# Patient Record
Sex: Male | Born: 1935 | Race: White | Hispanic: No | Smoking: Former smoker
Health system: Southern US, Community
[De-identification: ages and names within clinical notes are randomized; demographics above are authoritative.]

## PROBLEM LIST (undated history)

## (undated) DIAGNOSIS — M1990 Unspecified osteoarthritis, unspecified site: Secondary | ICD-10-CM

## (undated) DIAGNOSIS — E785 Hyperlipidemia, unspecified: Secondary | ICD-10-CM

## (undated) DIAGNOSIS — I10 Essential (primary) hypertension: Secondary | ICD-10-CM

## (undated) DIAGNOSIS — F0390 Unspecified dementia without behavioral disturbance: Secondary | ICD-10-CM

## (undated) HISTORY — DX: Essential (primary) hypertension: I10

## (undated) HISTORY — DX: Hyperlipidemia, unspecified: E78.5

## (undated) HISTORY — DX: Unspecified dementia, unspecified severity, without behavioral disturbance, psychotic disturbance, mood disturbance, and anxiety: F03.90

## (undated) HISTORY — DX: Unspecified osteoarthritis, unspecified site: M19.90

---

## 2014-12-29 ENCOUNTER — Ambulatory Visit (INDEPENDENT_AMBULATORY_CARE_PROVIDER_SITE_OTHER): Payer: Medicare Other | Admitting: Podiatry

## 2014-12-29 ENCOUNTER — Encounter: Payer: Self-pay | Admitting: Podiatry

## 2014-12-29 VITALS — BP 116/60 | HR 64 | Resp 12

## 2014-12-29 DIAGNOSIS — B351 Tinea unguium: Secondary | ICD-10-CM | POA: Diagnosis not present

## 2014-12-29 NOTE — Progress Notes (Signed)
   Subjective:    Patient ID: George Anderson, male    DOB: 1926/04/27, 81 y.o.   MRN: [id number]  HPI  N-THICK, SORE L-B/L GREAT TOENAIL D-1 YEAR C-WORSE O-SLOWLY A-PRESSURE T-TOPICAL ANTIFUGAL TREATMENT.  Patient's wife is requesting nail debridement. There is a history of a topical medication for the hypertrophic hallux toenails, however, unknown  Review of Systems  Constitutional: Positive for activity change, fatigue and unexpected weight change.  HENT: Positive for sinus pressure.   Eyes: Positive for visual disturbance.  Gastrointestinal: Positive for constipation.  Endocrine: Positive for cold intolerance.  Musculoskeletal: Positive for myalgias, back pain, joint swelling and gait problem.  Psychiatric/Behavioral: Positive for hallucinations and confusion. The patient is nervous/anxious.    [place]    Objective:   Physical Exam   Patient is confused and not able to respond to questioning Patient's wife is is present in the treatment room today  Vascular: DP and PT pulses 2/4 bilaterally Capillary reflex immediate bilaterally  Neurological: Ankle reflex equal and reactive bilaterally Vibratory sensation patient not able to respond Sensation to 10 g monofilament wire patient unable respond  Musculoskeletal: Texture and turgor within normal limits The hallux toenails are hypertrophic and deformed The remaining toenails have occasional texture and color changes  Musculoskeletal: Patient has slow gait using a cane Mild hammertoes 2-4 bilaterally There is no restriction ankle, subtalar, midtarsal joints bilaterally        Assessment & Plan:   Assessment: Patient appears confused and not able to respond to questioning Mycotic toenails primarily hallux nails, however the remaining toenails have mild mycotic involvement  Plan: Debridement of toenails 10 without any bleeding  Advised patient's wife to  return husband when necessary for nail debridement estimated intervals 3-5 months

## 2015-05-12 ENCOUNTER — Emergency Department (HOSPITAL_COMMUNITY): Payer: Medicare Other

## 2015-05-12 ENCOUNTER — Encounter (HOSPITAL_COMMUNITY): Payer: Self-pay | Admitting: Emergency Medicine

## 2015-05-12 ENCOUNTER — Inpatient Hospital Stay (HOSPITAL_COMMUNITY)
Admission: EM | Admit: 2015-05-12 | Discharge: 2015-05-15 | DRG: 552 | Disposition: A | Discharge status: Home Health | Payer: Medicare Other | Attending: Internal Medicine | Admitting: Internal Medicine

## 2015-05-12 DIAGNOSIS — Z87891 Personal history of nicotine dependence: Secondary | ICD-10-CM | POA: Diagnosis not present

## 2015-05-12 DIAGNOSIS — S12500A Unspecified displaced fracture of sixth cervical vertebra, initial encounter for closed fracture: Principal | ICD-10-CM | POA: Diagnosis present

## 2015-05-12 DIAGNOSIS — F0390 Unspecified dementia without behavioral disturbance: Secondary | ICD-10-CM | POA: Diagnosis present

## 2015-05-12 DIAGNOSIS — F0391 Unspecified dementia with behavioral disturbance: Secondary | ICD-10-CM | POA: Diagnosis present

## 2015-05-12 DIAGNOSIS — I493 Ventricular premature depolarization: Secondary | ICD-10-CM | POA: Diagnosis present

## 2015-05-12 DIAGNOSIS — W19XXXS Unspecified fall, sequela: Secondary | ICD-10-CM | POA: Diagnosis not present

## 2015-05-12 DIAGNOSIS — R911 Solitary pulmonary nodule: Secondary | ICD-10-CM | POA: Diagnosis present

## 2015-05-12 DIAGNOSIS — B952 Enterococcus as the cause of diseases classified elsewhere: Secondary | ICD-10-CM | POA: Diagnosis present

## 2015-05-12 DIAGNOSIS — Z951 Presence of aortocoronary bypass graft: Secondary | ICD-10-CM

## 2015-05-12 DIAGNOSIS — S0101XA Laceration without foreign body of scalp, initial encounter: Secondary | ICD-10-CM | POA: Diagnosis present

## 2015-05-12 DIAGNOSIS — S129XXD Fracture of neck, unspecified, subsequent encounter: Secondary | ICD-10-CM | POA: Diagnosis not present

## 2015-05-12 DIAGNOSIS — Z66 Do not resuscitate: Secondary | ICD-10-CM | POA: Diagnosis present

## 2015-05-12 DIAGNOSIS — N390 Urinary tract infection, site not specified: Secondary | ICD-10-CM | POA: Diagnosis present

## 2015-05-12 DIAGNOSIS — M1990 Unspecified osteoarthritis, unspecified site: Secondary | ICD-10-CM | POA: Diagnosis present

## 2015-05-12 DIAGNOSIS — E785 Hyperlipidemia, unspecified: Secondary | ICD-10-CM | POA: Diagnosis present

## 2015-05-12 DIAGNOSIS — M452 Ankylosing spondylitis of cervical region: Secondary | ICD-10-CM | POA: Diagnosis present

## 2015-05-12 DIAGNOSIS — I10 Essential (primary) hypertension: Secondary | ICD-10-CM | POA: Diagnosis present

## 2015-05-12 DIAGNOSIS — W1830XA Fall on same level, unspecified, initial encounter: Secondary | ICD-10-CM | POA: Diagnosis present

## 2015-05-12 DIAGNOSIS — Z79899 Other long term (current) drug therapy: Secondary | ICD-10-CM | POA: Diagnosis not present

## 2015-05-12 DIAGNOSIS — W19XXXA Unspecified fall, initial encounter: Secondary | ICD-10-CM | POA: Diagnosis present

## 2015-05-12 DIAGNOSIS — S129XXA Fracture of neck, unspecified, initial encounter: Secondary | ICD-10-CM | POA: Diagnosis present

## 2015-05-12 LAB — URINE MICROSCOPIC-ADD ON

## 2015-05-12 LAB — BASIC METABOLIC PANEL
ANION GAP: 6 (ref 5–15)
BUN: 26 mg/dL — ABNORMAL HIGH (ref 6–20)
CHLORIDE: 107 mmol/L (ref 101–111)
CO2: 27 mmol/L (ref 22–32)
Calcium: 8.5 mg/dL — ABNORMAL LOW (ref 8.9–10.3)
Creatinine, Ser: 1.1 mg/dL (ref 0.61–1.24)
GFR calc non Af Amer: 58 mL/min — ABNORMAL LOW (ref >60)
GLUCOSE: 95 mg/dL (ref 65–99)
POTASSIUM: 4.2 mmol/L (ref 3.5–5.1)
Sodium: 140 mmol/L (ref 135–145)

## 2015-05-12 LAB — CBC WITH DIFFERENTIAL/PLATELET
BASOS PCT: 0 % (ref 0–1)
Basophils Absolute: 0 10*3/uL (ref 0.0–0.1)
Eosinophils Absolute: 0 10*3/uL (ref 0.0–0.7)
Eosinophils Relative: 0 % (ref 0–5)
HEMATOCRIT: 37.3 % — AB (ref 39.0–52.0)
HEMOGLOBIN: 12.2 g/dL — AB (ref 13.0–17.0)
LYMPHS ABS: 1.6 10*3/uL (ref 0.7–4.0)
LYMPHS PCT: 13 % (ref 12–46)
MCH: 31.7 pg (ref 26.0–34.0)
MCHC: 32.7 g/dL (ref 30.0–36.0)
MCV: 96.9 fL (ref 78.0–100.0)
MONO ABS: 0.9 10*3/uL (ref 0.1–1.0)
MONOS PCT: 8 % (ref 3–12)
NEUTROS ABS: 9.1 10*3/uL — AB (ref 1.7–7.7)
NEUTROS PCT: 79 % — AB (ref 43–77)
Platelets: 191 10*3/uL (ref 150–400)
RBC: 3.85 MIL/uL — ABNORMAL LOW (ref 4.22–5.81)
RDW: 13.8 % (ref 11.5–15.5)
WBC: 11.6 10*3/uL — ABNORMAL HIGH (ref 4.0–10.5)

## 2015-05-12 LAB — URINALYSIS, ROUTINE W REFLEX MICROSCOPIC
Bilirubin Urine: NEGATIVE
Glucose, UA: NEGATIVE mg/dL
Ketones, ur: NEGATIVE mg/dL
Nitrite: NEGATIVE
PH: 7 (ref 5.0–8.0)
Protein, ur: NEGATIVE mg/dL
SPECIFIC GRAVITY, URINE: 1.015 (ref 1.005–1.030)
UROBILINOGEN UA: 1 mg/dL (ref 0.0–1.0)

## 2015-05-12 MED ORDER — DIVALPROEX SODIUM 125 MG PO CSDR
125.0000 mg | DELAYED_RELEASE_CAPSULE | Freq: Two times a day (BID) | ORAL | Status: DC
  Administered 2015-05-12 – 2015-05-15 (×6): 125 mg via ORAL
  Filled 2015-05-12 (×8): qty 1

## 2015-05-12 MED ORDER — ALPRAZOLAM 0.25 MG PO TABS
0.1250 mg | ORAL_TABLET | Freq: Every evening | ORAL | Status: DC | PRN
  Administered 2015-05-12 – 2015-05-14 (×2): 0.125 mg via ORAL
  Filled 2015-05-12 (×2): qty 1

## 2015-05-12 MED ORDER — ACETAMINOPHEN 325 MG PO TABS
650.0000 mg | ORAL_TABLET | Freq: Four times a day (QID) | ORAL | Status: DC | PRN
  Administered 2015-05-14 – 2015-05-15 (×3): 650 mg via ORAL
  Filled 2015-05-12 (×3): qty 2

## 2015-05-12 MED ORDER — SODIUM CHLORIDE 0.9 % IV SOLN
INTRAVENOUS | Status: DC
  Administered 2015-05-13 (×2): via INTRAVENOUS

## 2015-05-12 MED ORDER — TRAMADOL HCL 50 MG PO TABS
50.0000 mg | ORAL_TABLET | Freq: Four times a day (QID) | ORAL | Status: DC | PRN
  Administered 2015-05-13 – 2015-05-15 (×6): 50 mg via ORAL
  Filled 2015-05-12 (×7): qty 1

## 2015-05-12 MED ORDER — SODIUM CHLORIDE 0.9 % IV BOLUS (SEPSIS)
500.0000 mL | Freq: Once | INTRAVENOUS | Status: AC
  Administered 2015-05-12: 500 mL via INTRAVENOUS

## 2015-05-12 MED ORDER — DEXTROSE 5 % IV SOLN
1.0000 g | INTRAVENOUS | Status: DC
  Administered 2015-05-12 – 2015-05-14 (×3): 1 g via INTRAVENOUS
  Filled 2015-05-12 (×4): qty 10

## 2015-05-12 MED ORDER — MEMANTINE HCL ER 28 MG PO CP24
28.0000 mg | ORAL_CAPSULE | Freq: Every day | ORAL | Status: DC
  Administered 2015-05-13 – 2015-05-15 (×3): 28 mg via ORAL
  Filled 2015-05-12 (×3): qty 1

## 2015-05-12 MED ORDER — ONDANSETRON HCL 4 MG/2ML IJ SOLN: 4.0000 mg | Freq: Four times a day (QID) | INTRAMUSCULAR | Status: DC | PRN

## 2015-05-12 MED ORDER — LORATADINE 10 MG PO TABS
10.0000 mg | ORAL_TABLET | Freq: Every day | ORAL | Status: DC
  Administered 2015-05-13 – 2015-05-15 (×3): 10 mg via ORAL
  Filled 2015-05-12 (×3): qty 1

## 2015-05-12 MED ORDER — ENOXAPARIN SODIUM 30 MG/0.3ML ~~LOC~~ SOLN
30.0000 mg | SUBCUTANEOUS | Status: DC
  Administered 2015-05-13 – 2015-05-15 (×3): 30 mg via SUBCUTANEOUS
  Filled 2015-05-12 (×3): qty 0.3

## 2015-05-12 MED ORDER — RIVASTIGMINE 13.3 MG/24HR TD PT24
13.3000 mg | MEDICATED_PATCH | Freq: Every day | TRANSDERMAL | Status: DC
  Administered 2015-05-13 – 2015-05-15 (×3): 13.3 mg via TRANSDERMAL
  Filled 2015-05-12 (×3): qty 1

## 2015-05-12 MED ORDER — ONDANSETRON HCL 4 MG PO TABS: 4.0000 mg | ORAL_TABLET | Freq: Four times a day (QID) | ORAL | Status: DC | PRN

## 2015-05-12 NOTE — ED Notes (Signed)
Called pharmacy to confirm that depakote was being sent.

## 2015-05-12 NOTE — Consult Note (Signed)
Reason for Consult: C6 fracture Referring Physician: Dr. Norberto Sorenson George Anderson is an 81 y.o. male.  HPI: 81 year old male with progressive dementia. Patient fell from standing height today. Questionable loss of consciousness. Obvious blow to left-sided head with subsequent laceration. Patient profoundly demented and can provide no meaningful history nor can he deliver significant complaints. Patient moving freely without evidence of motor loss. Patient with known ankylosing spondylitis.  Past Medical History  Diagnosis Date  . Arthritis   . Hyperlipidemia   . Hypertension   . Dementia     History reviewed. No pertinent past surgical history.  No family history on file.  Social History:  reports that he has quit smoking. He does not have any smokeless tobacco history on file. He reports that he does not drink alcohol or use illicit drugs.  Allergies: No Known Allergies  Medications: I have reviewed the patient's current medications.  No results found for this or any previous visit (from the past 48 hour(s)).  Ct Head Wo Contrast  05/12/2015   CLINICAL DATA:  Patient status post fall with large laceration on the back of the head. No reported loss of consciousness.  EXAM: CT HEAD WITHOUT CONTRAST  CT CERVICAL SPINE WITHOUT CONTRAST  TECHNIQUE: Multidetector CT imaging of the head and cervical spine was performed following the standard protocol without intravenous contrast. Multiplanar CT image reconstructions of the cervical spine were also generated.  COMPARISON:  None.  FINDINGS: CT HEAD FINDINGS  Ventricles and sulci are prominent compatible with atrophy. Periventricular and subcortical white matter hypodensity compatible with chronic small vessel ischemic changes. Orbits are unremarkable. Paranasal sinuses are unremarkable. Mastoid air cells are well aerated. No displaced skull fracture. Soft tissue swelling and laceration overlying left posterior calvarium.  CT CERVICAL SPINE FINDINGS   Straightening of the normal cervical lordosis. 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 There is a 3 column fracture extending through C6 vertebral body extending to the posterior elements. No evidence for hematoma within the canal on CT imaging. Marked degenerative changes in the craniocervical junction. Lateral masses articulate appropriately with the dens. Biapical pulmonary nodularity measuring up to 7 mm within the left upper lobe.  IMPRESSION: There is a 3 column fracture through C6 vertebral body extending to the posterior elements. 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  No acute intracranial process. Soft tissue injury overlying the left posterior calvarium.  Marked parenchymal atrophy more focal to the temporal lobes.  Biapical pulmonary nodularity measuring up to 7 mm. If the patient is at high risk for bronchogenic carcinoma, follow-up chest CT at 3-49months is recommended. If the patient is at low risk for bronchogenic carcinoma, follow-up chest CT at 6-12 months is recommended. This recommendation follows the consensus statement: Guidelines for Management of Small Pulmonary Nodules Detected on CT Scans: A Statement from the Fleischner Society as published in Radiology 2005; 237:395-400.  Critical Value/emergent results were called by telephone at the time of interpretation on 05/12/2015 at 12:53 pm to Dr. Donnetta Hutching , who verbally acknowledged these results.   Electronically Signed   By: Annia Belt M.D.   On: 05/12/2015 13:04   Ct Cervical Spine Wo Contrast  05/12/2015   CLINICAL DATA:  Patient status post fall with large laceration on the back of the head. No reported loss of consciousness.  EXAM: CT HEAD WITHOUT CONTRAST  CT CERVICAL SPINE WITHOUT CONTRAST  TECHNIQUE: Multidetector CT imaging of the head and cervical spine was performed following the  standard protocol without intravenous contrast. Multiplanar CT image reconstructions of  the cervical spine were also generated.  COMPARISON:  None.  FINDINGS: CT HEAD FINDINGS  Ventricles and sulci are prominent compatible with atrophy. Periventricular and subcortical white matter hypodensity compatible with chronic small vessel ischemic changes. Orbits are unremarkable. Paranasal sinuses are unremarkable. Mastoid air cells are well aerated. No displaced skull fracture. Soft tissue swelling and laceration overlying left posterior calvarium.  CT CERVICAL SPINE FINDINGS  Straightening of the normal cervical lordosis. 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 There is a 3 column fracture extending through C6 vertebral body extending to the posterior elements. No evidence for hematoma within the canal on CT imaging. Marked degenerative changes in the craniocervical junction. Lateral masses articulate appropriately with the dens. Biapical pulmonary nodularity measuring up to 7 mm within the left upper lobe.  IMPRESSION: There is a 3 column fracture through C6 vertebral body extending to the posterior elements. 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  No acute intracranial process. Soft tissue injury overlying the left posterior calvarium.  Marked parenchymal atrophy more focal to the temporal lobes.  Biapical pulmonary nodularity measuring up to 7 mm. If the patient is at high risk for bronchogenic carcinoma, follow-up chest CT at 3-81months is recommended. If the patient is at low risk for bronchogenic carcinoma, follow-up chest CT at 6-12 months is recommended. This recommendation follows the consensus statement: Guidelines for Management of Small Pulmonary Nodules Detected on CT Scans: A Statement from the Fleischner Society as published in Radiology 2005; 237:395-400.  Critical Value/emergent results were called by telephone at the time of interpretation on 05/12/2015 at 12:53 pm to Dr. Donnetta Hutching , who verbally acknowledged these results.    Electronically Signed   By: Annia Belt M.D.   On: 05/12/2015 13:04    Review of systems not obtained due to patient factors. Blood pressure 135/72, pulse 69, temperature 97.3 F (36.3 C), temperature source Axillary, resp. rate 16, SpO2 97 %. Patient is awake and alert. He is disoriented but cooperative. Cranial nerve function appears intact. Motor and sensory function of his extremities are normal. Examination head ears eyes and throat demonstrates a superficial laceration in his left frontal parietal region with underlying hematoma. No obvious bony normality. Oropharynx nasopharynx and external auditory canals are clear. Cervical spine reveals flexed posture without palpable bony abnormality. Airway midline chest and abdomen benign.  Assessment/Plan: Nondisplaced C6 fracture in the setting of ankylosing spondylitis. Situation complicated by profound dementia. I discussed situation with the patient's daughter. She does not wish to consider any type of surgical intervention which I think sounds appropriate. At this point I would recommend immobilization in an Aspen cervical collar. I would recommend admission to medical service for physical therapy evaluation prior to discharge home. Follow-up with me in 4 weeks.  Ermal Brzozowski A 05/12/2015, 2:03 PM

## 2015-05-12 NOTE — ED Notes (Signed)
Internal medicine at bedside

## 2015-05-12 NOTE — ED Provider Notes (Addendum)
CSN: [id number]     Arrival date & time 05/12/15  1004 History   First MD Initiated Contact with Patient 05/12/15 1019     Chief Complaint  Patient presents with  . Fall  . Head Laceration     (Consider location/radiation/quality/duration/timing/severity/associated sxs/prior Treatment) HPI..... Level V caveat for dementia. Patient had accidental mechanical fall earlier today striking his head on furniture. He has profound dementia, but lives with his wife independently at home. Wife reports his behavior to be normal. He now has a 2.5 cm laceration on his left parietal scalp. He states no specific areas of pain. Primary care is [facility].  Past Medical History  Diagnosis Date  . Arthritis   . Hyperlipidemia   . Hypertension   . Dementia    History reviewed. No pertinent past surgical history. No family history on file. Social History  Substance Use Topics  . Smoking status: Former Games developer  . Smokeless tobacco: None  . Alcohol Use: No    Review of Systems  Unable to perform ROS: Dementia      Allergies  Review of patient's allergies indicates no known allergies.  Home Medications   Prior to Admission medications   Medication Sig Start Date End Date Taking? Authorizing Provider  ALPRAZolam (XANAX) 0.25 MG tablet Take 0.125 mg by mouth at bedtime as needed for anxiety.   Yes Historical Provider, MD  divalproex (DEPAKOTE SPRINKLE) 125 MG capsule Take 125 mg by mouth 2 (two) times daily.  12/08/14  Yes Historical Provider, MD  loratadine (CLARITIN) 10 MG tablet Take 10 mg by mouth daily.   Yes Historical Provider, MD  NAMENDA XR 28 MG CP24 24 hr capsule  12/19/14  Yes Historical Provider, MD  Rivastigmine 13.3 MG/24HR PT24  12/08/14  Yes Historical Provider, MD  traMADol (ULTRAM) 50 MG tablet  12/24/14  Yes Historical Provider, MD  amLODipine-benazepril (LOTREL) 5-40 MG per capsule  12/09/14   Historical Provider, MD  atorvastatin (LIPITOR) 40 MG tablet  12/09/14    Historical Provider, MD   BP 162/82 mmHg  Pulse 74  Temp(Src) 97.3 F (36.3 C) (Axillary)  Resp 19  SpO2 100% Physical Exam  Constitutional:  Demented, alert  HENT:  Head: Normocephalic.  2.5 cm laceration on the left parietal scalp  Eyes: Conjunctivae and EOM are normal. Pupils are equal, round, and reactive to light.  Neck: Normal range of motion. Neck supple.  Cardiovascular: Normal rate and regular rhythm.   Pulmonary/Chest: Effort normal and breath sounds normal.  Abdominal: Soft. Bowel sounds are normal.  Musculoskeletal: Normal range of motion.  Neurological: He is alert.  Skin: Skin is warm and dry.  Psychiatric:  Demented  Nursing note and vitals reviewed.   ED Course  LACERATION REPAIR Date/Time: 05/12/2015 4:01 PM Performed by: Donnetta Hutching Authorized by: Donnetta Hutching Consent: Verbal consent obtained. Risks and benefits: risks, benefits and alternatives were discussed Consent given by: spouse Comments: 1530:   2.5 cm laceration on left parietal scalp irrigated with copious amount of normal saline. Hair trimmed. Wound was stapled 3. Patient tolerated procedure well.   (including critical care time) Labs Review Labs Reviewed  CBC WITH DIFFERENTIAL/PLATELET - Abnormal; Notable for the following:    WBC 11.6 (*)    RBC 3.85 (*)    Hemoglobin 12.2 (*)    HCT 37.3 (*)    Neutrophils Relative % 79 (*)    Neutro Abs 9.1 (*)    All other components within normal limits  BASIC  METABOLIC PANEL  URINALYSIS, ROUTINE W REFLEX MICROSCOPIC (NOT AT [facility])    Imaging Review Ct Head Wo Contrast  05/12/2015   CLINICAL DATA:  Patient status post fall with large laceration on the back of the head. No reported loss of consciousness.  EXAM: CT HEAD WITHOUT CONTRAST  CT CERVICAL SPINE WITHOUT CONTRAST  TECHNIQUE: Multidetector CT imaging of the head and cervical spine was performed following the standard protocol without intravenous contrast. Multiplanar CT image  reconstructions of the cervical spine were also generated.  COMPARISON:  None.  FINDINGS: CT HEAD FINDINGS  Ventricles and sulci are prominent compatible with atrophy. Periventricular and subcortical white matter hypodensity compatible with chronic small vessel ischemic changes. Orbits are unremarkable. Paranasal sinuses are unremarkable. Mastoid air cells are well aerated. No displaced skull fracture. Soft tissue swelling and laceration overlying left posterior calvarium.  CT CERVICAL SPINE FINDINGS  Straightening of the normal cervical lordosis. 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 There is a 3 column fracture extending through C6 vertebral body extending to the posterior elements. No evidence for hematoma within the canal on CT imaging. Marked degenerative changes in the craniocervical junction. Lateral masses articulate appropriately with the dens. Biapical pulmonary nodularity measuring up to 7 mm within the left upper lobe.  IMPRESSION: There is a 3 column fracture through C6 vertebral body extending to the posterior elements. 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  No acute intracranial process. Soft tissue injury overlying the left posterior calvarium.  Marked parenchymal atrophy more focal to the temporal lobes.  Biapical pulmonary nodularity measuring up to 7 mm. If the patient is at high risk for bronchogenic carcinoma, follow-up chest CT at 3-55months is recommended. If the patient is at low risk for bronchogenic carcinoma, follow-up chest CT at 6-12 months is recommended. This recommendation follows the consensus statement: Guidelines for Management of Small Pulmonary Nodules Detected on CT Scans: A Statement from the Fleischner Society as published in Radiology 2005; 237:395-400.  Critical Value/emergent results were called by telephone at the time of interpretation on 05/12/2015 at 12:53 pm to Dr. Donnetta Hutching , who verbally acknowledged  these results.   Electronically Signed   By: Annia Belt M.D.   On: 05/12/2015 13:04   Ct Cervical Spine Wo Contrast  05/12/2015   CLINICAL DATA:  Patient status post fall with large laceration on the back of the head. No reported loss of consciousness.  EXAM: CT HEAD WITHOUT CONTRAST  CT CERVICAL SPINE WITHOUT CONTRAST  TECHNIQUE: Multidetector CT imaging of the head and cervical spine was performed following the standard protocol without intravenous contrast. Multiplanar CT image reconstructions of the cervical spine were also generated.  COMPARISON:  None.  FINDINGS: CT HEAD FINDINGS  Ventricles and sulci are prominent compatible with atrophy. Periventricular and subcortical white matter hypodensity compatible with chronic small vessel ischemic changes. Orbits are unremarkable. Paranasal sinuses are unremarkable. Mastoid air cells are well aerated. No displaced skull fracture. Soft tissue swelling and laceration overlying left posterior calvarium.  CT CERVICAL SPINE FINDINGS  Straightening of the normal cervical lordosis. 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 There is a 3 column fracture extending through C6 vertebral body extending to the posterior elements. No evidence for hematoma within the canal on CT imaging. Marked degenerative changes in the craniocervical junction. Lateral masses articulate appropriately with the dens. Biapical pulmonary nodularity measuring up to 7 mm within the left upper lobe.  IMPRESSION: There is a 3 column fracture through  C6 vertebral body extending to the posterior elements. 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  No acute intracranial process. Soft tissue injury overlying the left posterior calvarium.  Marked parenchymal atrophy more focal to the temporal lobes.  Biapical pulmonary nodularity measuring up to 7 mm. If the patient is at high risk for bronchogenic carcinoma, follow-up chest CT at 3-33months is  recommended. If the patient is at low risk for bronchogenic carcinoma, follow-up chest CT at 6-12 months is recommended. This recommendation follows the consensus statement: Guidelines for Management of Small Pulmonary Nodules Detected on CT Scans: A Statement from the Fleischner Society as published in Radiology 2005; 237:395-400.  Critical Value/emergent results were called by telephone at the time of interpretation on 05/12/2015 at 12:53 pm to Dr. Donnetta Hutching , who verbally acknowledged these results.   Electronically Signed   By: Annia Belt M.D.   On: 05/12/2015 13:04   I have personally reviewed and evaluated these images and lab results as part of my medical decision making.   EKG Interpretation None     CRITICAL CARE Performed by: Donnetta Hutching Total critical care time: 45 min....Marland KitchenMarland KitchenCritical care time was exclusive of separately billable procedures and treating other patients. Critical care was necessary to treat or prevent imminent or life-threatening deterioration. Critical care was time spent personally by me on the following activities: development of treatment plan with patient and/or surrogate as well as nursing, discussions with consultants, evaluation of patient's response to treatment, examination of patient, obtaining history from patient or surrogate, ordering and performing treatments and interventions, ordering and review of laboratory studies, ordering and review of radiographic studies, pulse oximetry and re-evaluation of patient's condition. MDM   Final diagnoses:  C6 cervical fracture, closed, initial encounter  Fall, initial encounter  Dementia, with behavioral disturbance  Laceration of scalp, initial encounter   Patient is neurologically intact. C6 fracture discussed with Dr. Karene Fry.  His recommendation was immobilization via an Aspen collar. Laceration repaired by self. Discussed findings with the family. Admit to general medicine.  Disc c Dr Myrtis Ser, MD 05/12/15 1610  Donnetta Hutching, MD 05/12/15 [id number]

## 2015-05-12 NOTE — Progress Notes (Signed)
CSW spoke with pt's wife and daughter. At this time, they are not interested in NHP at d/c.  They would like pt to go home with [facility] services at d/c.  The patient has used [place] in the past and would like to use them again.  ED RNCM informed.

## 2015-05-12 NOTE — H&P (Signed)
[facility] History and Physical  George Anderson ZOX:[id number] DOB: 05-17-1926 DOA: 05/12/2015  Referring physician:EDP PCP: [name], MD   Chief Complaint: fall  HPI: George Anderson is a 81 y.o. male with past medical history of dementia, remote history of CABG, history of ankylosing spondylitis was brought to the ER by his wife with the above complaints. Patient goes to an adult dementia service program during the daytime and was getting ready to go there this morning. He was eating breakfast at the dining table his wife was in and underwent him getting dressed as well when she suddenly heard a Thud, she found him down on the floor in between the cabinets and he was bleeding from his scalp then. He was responsive when she found him and complained of neck pain. Subsequently EMS was called and he was brought to the emergency room where he was noted to have a C6 fracture he also had a scalp laceration with which was sutured using 3 staples. In addition he was diagnosed with a urinary tract infection yesterday when he was taken to his primary care doctor's office based on some urinary discomfort/dysuria associated with malodorous and cloudy urine.  Review of Systems: Unable to obtain due to dementia Constitutional:  No weight loss, night sweats, Fevers, chills, fatigue.  HEENT:  No headaches, Difficulty swallowing,Tooth/dental problems,Sore throat,  No sneezing, itching, ear ache, nasal congestion, post nasal drip,  Cardio-vascular:  No chest pain, Orthopnea, PND, swelling in lower extremities, anasarca, dizziness, palpitations  GI:  No heartburn, indigestion, abdominal pain, nausea, vomiting, diarrhea, change in bowel habits, loss of appetite  Resp:  No shortness of breath with exertion or at rest. No excess mucus, no productive cough, No non-productive cough, No coughing up of blood.No change in color of mucus.No wheezing.No chest wall deformity  Skin:  no rash or lesions.    GU:  no dysuria, change in color of urine, no urgency or frequency. No flank pain.  Musculoskeletal:  No joint pain or swelling. No decreased range of motion. No back pain.  Psych:  No change in mood or affect. No depression or anxiety. No memory loss.   Past Medical History  Diagnosis Date  . Arthritis   . Hyperlipidemia   . Hypertension   . Dementia    History reviewed. No pertinent past surgical history. Social History:  reports that he has quit smoking. He does not have any smokeless tobacco history on file. He reports that he does not drink alcohol or use illicit drugs.  No Known Allergies  No family history on file. -Unable to obtain due to dementia  Prior to Admission medications   Medication Sig Start Date End Date Taking? Authorizing Provider  ALPRAZolam (XANAX) 0.25 MG tablet Take 0.125 mg by mouth at bedtime as needed for anxiety.   Yes Historical Provider, MD  divalproex (DEPAKOTE SPRINKLE) 125 MG capsule Take 125 mg by mouth 2 (two) times daily.  12/08/14  Yes Historical Provider, MD  loratadine (CLARITIN) 10 MG tablet Take 10 mg by mouth daily.   Yes Historical Provider, MD  NAMENDA XR 28 MG CP24 24 hr capsule  12/19/14  Yes Historical Provider, MD  Rivastigmine 13.3 MG/24HR PT24  12/08/14  Yes Historical Provider, MD  traMADol (ULTRAM) 50 MG tablet  12/24/14  Yes Historical Provider, MD  amLODipine-benazepril (LOTREL) 5-40 MG per capsule  12/09/14   Historical Provider, MD  atorvastatin (LIPITOR) 40 MG tablet  12/09/14   Historical Provider, MD   Physical Exam:  Filed Vitals:   05/12/15 1430 05/12/15 1515 05/12/15 1645 05/12/15 1747  BP: 162/82 166/83 136/77 156/82  Pulse: 74 73  77  Temp:      TempSrc:      Resp: 19 17 14 16   SpO2: 100% 97%  99%    Wt Readings from Last 3 Encounters:  No data found for Wt    General:  Appears calm and comfortable, alert awake oriented to self only, pleasantly confused HEENT: He has a laceration on his scalp with 3 staples  wearing an Aspen collar,  Eyes: PERRL, normal lids, irises & conjunctiva ENT: grossly normal hearing, lips & tongue Neck: no LAD, masses or thyromegaly Cardiovascular: RRR, no m/r/g. No LE edema. Telemetry: SR, no arrhythmias  Respiratory: CTA bilaterally, no w/r/r. Normal respiratory effort. Abdomen: soft, ntnd Skin: no rash or induration seen on limited exam Musculoskeletal: grossly normal tone BUE/BLE Psychiatric: grossly normal mood and affect, speech fluent and appropriate Neurologic: grossly non-focal.          Labs on Admission:  Basic Metabolic Panel:  Recent Labs Lab 05/12/15 1526  NA 140  K 4.2  CL 107  CO2 27  GLUCOSE 95  BUN 26*  CREATININE 1.10  CALCIUM 8.5*   Liver Function Tests: No results for input(s): AST, ALT, ALKPHOS, BILITOT, PROT, ALBUMIN in the last 168 hours. No results for input(s): LIPASE, AMYLASE in the last 168 hours. No results for input(s): AMMONIA in the last 168 hours. CBC:  Recent Labs Lab 05/12/15 1526  WBC 11.6*  NEUTROABS 9.1*  HGB 12.2*  HCT 37.3*  MCV 96.9  PLT 191   Cardiac Enzymes: No results for input(s): CKTOTAL, CKMB, CKMBINDEX, TROPONINI in the last 168 hours.  BNP (last 3 results) No results for input(s): BNP in the last 8760 hours.  ProBNP (last 3 results) No results for input(s): PROBNP in the last 8760 hours.  CBG: No results for input(s): GLUCAP in the last 168 hours.  Radiological Exams on Admission: Ct Head Wo Contrast  05/12/2015   CLINICAL DATA:  Patient status post fall with large laceration on the back of the head. No reported loss of consciousness.  EXAM: CT HEAD WITHOUT CONTRAST  CT CERVICAL SPINE WITHOUT CONTRAST  TECHNIQUE: Multidetector CT imaging of the head and cervical spine was performed following the standard protocol without intravenous contrast. Multiplanar CT image reconstructions of the cervical spine were also generated.  COMPARISON:  None.  FINDINGS: CT HEAD FINDINGS  Ventricles and  sulci are prominent compatible with atrophy. Periventricular and subcortical white matter hypodensity compatible with chronic small vessel ischemic changes. Orbits are unremarkable. Paranasal sinuses are unremarkable. Mastoid air cells are well aerated. No displaced skull fracture. Soft tissue swelling and laceration overlying left posterior calvarium.  CT CERVICAL SPINE FINDINGS  Straightening of the normal cervical lordosis. 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 There is a 3 column fracture extending through C6 vertebral body extending to the posterior elements. No evidence for hematoma within the canal on CT imaging. Marked degenerative changes in the craniocervical junction. Lateral masses articulate appropriately with the dens. Biapical pulmonary nodularity measuring up to 7 mm within the left upper lobe.  IMPRESSION: There is a 3 column fracture through C6 vertebral body extending to the posterior elements. 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  No acute intracranial process. Soft tissue injury overlying the left posterior calvarium.  Marked parenchymal atrophy more focal to the temporal lobes.  Biapical pulmonary nodularity  measuring up to 7 mm. If the patient is at high risk for bronchogenic carcinoma, follow-up chest CT at 3-70months is recommended. If the patient is at low risk for bronchogenic carcinoma, follow-up chest CT at 6-12 months is recommended. This recommendation follows the consensus statement: Guidelines for Management of Small Pulmonary Nodules Detected on CT Scans: A Statement from the Fleischner Society as published in Radiology 2005; 237:395-400.  Critical Value/emergent results were called by telephone at the time of interpretation on 05/12/2015 at 12:53 pm to Dr. Donnetta Hutching , who verbally acknowledged these results.   Electronically Signed   By: Annia Belt M.D.   On: 05/12/2015 13:04   Ct Cervical Spine Wo  Contrast  05/12/2015   CLINICAL DATA:  Patient status post fall with large laceration on the back of the head. No reported loss of consciousness.  EXAM: CT HEAD WITHOUT CONTRAST  CT CERVICAL SPINE WITHOUT CONTRAST  TECHNIQUE: Multidetector CT imaging of the head and cervical spine was performed following the standard protocol without intravenous contrast. Multiplanar CT image reconstructions of the cervical spine were also generated.  COMPARISON:  None.  FINDINGS: CT HEAD FINDINGS  Ventricles and sulci are prominent compatible with atrophy. Periventricular and subcortical white matter hypodensity compatible with chronic small vessel ischemic changes. Orbits are unremarkable. Paranasal sinuses are unremarkable. Mastoid air cells are well aerated. No displaced skull fracture. Soft tissue swelling and laceration overlying left posterior calvarium.  CT CERVICAL SPINE FINDINGS  Straightening of the normal cervical lordosis. 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 There is a 3 column fracture extending through C6 vertebral body extending to the posterior elements. No evidence for hematoma within the canal on CT imaging. Marked degenerative changes in the craniocervical junction. Lateral masses articulate appropriately with the dens. Biapical pulmonary nodularity measuring up to 7 mm within the left upper lobe.  IMPRESSION: There is a 3 column fracture through C6 vertebral body extending to the posterior elements. 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  No acute intracranial process. Soft tissue injury overlying the left posterior calvarium.  Marked parenchymal atrophy more focal to the temporal lobes.  Biapical pulmonary nodularity measuring up to 7 mm. If the patient is at high risk for bronchogenic carcinoma, follow-up chest CT at 3-83months is recommended. If the patient is at low risk for bronchogenic carcinoma, follow-up chest CT at 6-12 months is  recommended. This recommendation follows the consensus statement: Guidelines for Management of Small Pulmonary Nodules Detected on CT Scans: A Statement from the Fleischner Society as published in Radiology 2005; 237:395-400.  Critical Value/emergent results were called by telephone at the time of interpretation on 05/12/2015 at 12:53 pm to Dr. Donnetta Hutching , who verbally acknowledged these results.   Electronically Signed   By: Annia Belt M.D.   On: 05/12/2015 13:04    EKG: Independently reviewed.pending, ordered  Assessment/Plan  1.  Cervical spine fracture -Seen by neurosurgery Dr. Edwyna Perfect recommended Aspen collar, conservative management -Follow-up with Dr. Jordan Likes in 4 weeks -Controlled with Tylenol and tramadol at this time -Physical therapy and OT evaluation  2. Urinary tract infection -Start IV Rocephin, follow-up urine cultures  3. Dementia with history of sundowning and behavioral problems -Continue home regimen of Namenda, and a statement, Depakote and Xanax at bedtime  4. Lung nodule -Needs follow-up  5. Scalp laceration -Local wound care  6. Fall  -Unable to rule out syncope , check EKG -Denies any chest pain or any other cardio pulmonary  symptoms  -Monitor on  telemetry   Code Status: DNR DVT Prophylaxis: lovenox Family Communication: wife Disposition Plan: home vs SNF  Time spent:  [facility] Pager [phone]

## 2015-05-12 NOTE — ED Notes (Signed)
From home via [facility], fell, hit head, no LOC, has large lac to back of head, bleeding controlled on arrival, no blood thinners, VSS, hx dementia, c-collar in place

## 2015-05-13 ENCOUNTER — Encounter (HOSPITAL_COMMUNITY): Payer: Self-pay | Admitting: *Deleted

## 2015-05-13 DIAGNOSIS — F0391 Unspecified dementia with behavioral disturbance: Secondary | ICD-10-CM

## 2015-05-13 DIAGNOSIS — S129XXD Fracture of neck, unspecified, subsequent encounter: Secondary | ICD-10-CM

## 2015-05-13 DIAGNOSIS — W19XXXS Unspecified fall, sequela: Secondary | ICD-10-CM

## 2015-05-13 DIAGNOSIS — N390 Urinary tract infection, site not specified: Secondary | ICD-10-CM

## 2015-05-13 DIAGNOSIS — R911 Solitary pulmonary nodule: Secondary | ICD-10-CM

## 2015-05-13 LAB — CBC
HCT: 32.8 % — ABNORMAL LOW (ref 39.0–52.0)
Hemoglobin: 10.8 g/dL — ABNORMAL LOW (ref 13.0–17.0)
MCH: 32.2 pg (ref 26.0–34.0)
MCHC: 32.9 g/dL (ref 30.0–36.0)
MCV: 97.9 fL (ref 78.0–100.0)
PLATELETS: 173 10*3/uL (ref 150–400)
RBC: 3.35 MIL/uL — AB (ref 4.22–5.81)
RDW: 13.9 % (ref 11.5–15.5)
WBC: 7.4 10*3/uL (ref 4.0–10.5)

## 2015-05-13 LAB — BASIC METABOLIC PANEL
ANION GAP: 7 (ref 5–15)
BUN: 21 mg/dL — ABNORMAL HIGH (ref 6–20)
CALCIUM: 8.5 mg/dL — AB (ref 8.9–10.3)
CO2: 23 mmol/L (ref 22–32)
Chloride: 110 mmol/L (ref 101–111)
Creatinine, Ser: 1.02 mg/dL (ref 0.61–1.24)
GLUCOSE: 90 mg/dL (ref 65–99)
POTASSIUM: 4.5 mmol/L (ref 3.5–5.1)
SODIUM: 140 mmol/L (ref 135–145)

## 2015-05-13 NOTE — Care Management Note (Signed)
Case Management Note  Patient Details  Name: George Anderson MRN: [id number] Date of Birth: 04-13-1926  Subjective/Objective:          CM following for progression and d/c planning.          Action/Plan: Noted referral for HH needs, pt has used [place] [facility] services in the past and wishes to use that agency in the future. Romero Belling notified. Await orders.  Expected Discharge Date:                  Expected Discharge Plan:  Home w Home Health Services  In-House Referral:  NA  Discharge planning Services  CM Consult  Post Acute Care Choice:    Choice offered to:     DME Arranged:    DME Agency:     HH Arranged:    HH Agency:  [facility]  Status of Service:  In process, will continue to follow  Medicare Important Message Given:    Date Medicare IM Given:    Medicare IM give by:    Date Additional Medicare IM Given:    Additional Medicare Important Message give by:     If discussed at Long Length of Stay Meetings, dates discussed:    Additional Comments:  Starlyn Skeans, RN 05/13/2015, 2:43 PM

## 2015-05-13 NOTE — Progress Notes (Signed)
Orthopedic Tech Progress Note Patient Details:  Joh Rao Mar 13, 1926 [id number]  Patient ID: George Anderson, male   DOB: Mar 24, 1926, 81 y.o.   MRN: [id number] Called in bio-tech brace order; spoke with [name], George Anderson 05/13/2015, 10:48 AM

## 2015-05-13 NOTE — Progress Notes (Signed)
Patient Demographics  George Anderson, is a 81 y.o. male, DOB - July 08, 1926, ZOX:[id number]  Admit date - 05/12/2015   Admitting Physician Zannie Cove, MD  Outpatient Primary MD for the patient is George Inch, MD  LOS - 1   Chief Complaint  Patient presents with  . Fall  . Head Laceration         Subjective:   Illene Anderson today has, No headache, No chest pain, No abdominal pain - No Nausea, No Cough - SOB.   Assessment & Plan    Principal Problem:   Cervical spine fracture Active Problems:   Fall   Dementia   Lung nodule   UTI (lower urinary tract infection)  Cervical spine fracture -Seen by neurosurgery Dr. Jordan Likes recommended Aspen collar, conservative management ,Follow-up with Dr. Jordan Likes in 4 weeks - When necessary pain medicine -Physical therapy and OT evaluation  Urinary tract infection -Start IV Rocephin, follow-up urine cultures   Dementia with history of sundowning and behavioral problems -Continue home regimen of Namenda, and a statement, Depakote and Xanax at bedtime  Lung nodules -Needs follow-up  Scalp laceration -Local wound care   Fall  -Unable to rule out syncope ,  - EKG nonacute -Denies any chest pain or any other cardio pulmonary symptoms  - Monitor on telemetry   Code Status: DNR  Family Communication: none at bedside  Disposition Plan: Pending PT evaluation    Procedures None  Consults   Neurosurgery   Medications  Scheduled Meds: . cefTRIAXone (ROCEPHIN)  IV  1 g Intravenous Q24H  . divalproex  125 mg Oral BID  . enoxaparin (LOVENOX) injection  30 mg Subcutaneous Q24H  . loratadine  10 mg Oral Daily  . memantine  28 mg Oral Daily  . Rivastigmine  13.3 mg Transdermal Daily   Continuous Infusions: . sodium chloride 75 mL/hr at 05/13/15 0227   PRN Meds:.acetaminophen, ALPRAZolam, ondansetron **OR** ondansetron (ZOFRAN) IV,  traMADol  DVT Prophylaxis  Lovenox   Lab Results  Component Value Date   PLT 173 05/13/2015    Antibiotics    Anti-infectives    Start     Dose/Rate Route Frequency Ordered Stop   05/12/15 1800  cefTRIAXone (ROCEPHIN) 1 g in dextrose 5 % 50 mL IVPB     1 g 100 mL/hr over 30 Minutes Intravenous Every 24 hours 05/12/15 1748            Objective:   Filed Vitals:   05/12/15 1930 05/12/15 2159 05/13/15 0504 05/13/15 0933  BP: 145/100 174/81 168/54 161/86  Pulse: 77 81 63 80  Temp:  97.4 F (36.3 C) 97.8 F (36.6 C) 97.5 F (36.4 C)  TempSrc:  Axillary Axillary Oral  Resp: Weight:  71.305 kg (157 lb 3.2 oz)    SpO2: 100% 99% 99% 98%    Wt Readings from Last 3 Encounters:  05/12/15 71.305 kg (157 lb 3.2 oz)     Intake/Output Summary (Last 24 hours) at 05/13/15 1339 Last data filed at 05/13/15 0818  Gross per 24 hour  Intake    500 ml  Output      0 ml  Net    500 ml     Physical Exam  Awake Alert, confused, laceration and right scalp with staples. Aspen collar,No JVD,  Symmetrical Chest wall movement, Good air movement bilaterally,  RRR,No Gallops, No Parasternal Heave +ve B.Sounds, Abd Soft, No tenderness, No organomegaly appriciated, No rebound - guarding or rigidity. No Cyanosis, Clubbing or edema,   Data Review   Micro Results Recent Results (from the past 240 hour(s))  Culture, Urine     Status: None (Preliminary result)   Collection Time: 05/12/15  4:30 PM  Result Value Ref Range Status   Specimen Description URINE, RANDOM  Final   Special Requests NONE  Final   Culture CULTURE REINCUBATED FOR BETTER GROWTH  Final   Report Status PENDING  Incomplete    Radiology Reports Ct Head Wo Contrast  05/12/2015   CLINICAL DATA:  Patient status post fall with large laceration on the back of the head. No reported loss of consciousness.  EXAM: CT HEAD WITHOUT CONTRAST  CT CERVICAL SPINE WITHOUT CONTRAST  TECHNIQUE: Multidetector CT imaging of  the head and cervical spine was performed following the standard protocol without intravenous contrast. Multiplanar CT image reconstructions of the cervical spine were also generated.  COMPARISON:  None.  FINDINGS: CT HEAD FINDINGS  Ventricles and sulci are prominent compatible with atrophy. Periventricular and subcortical white matter hypodensity compatible with chronic small vessel ischemic changes. Orbits are unremarkable. Paranasal sinuses are unremarkable. Mastoid air cells are well aerated. No displaced skull fracture. Soft tissue swelling and laceration overlying left posterior calvarium.  CT CERVICAL SPINE FINDINGS  Straightening of the normal cervical lordosis. 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 There is a 3 column fracture extending through C6 vertebral body extending to the posterior elements. No evidence for hematoma within the canal on CT imaging. Marked degenerative changes in the craniocervical junction. Lateral masses articulate appropriately with the dens. Biapical pulmonary nodularity measuring up to 7 mm within the left upper lobe.  IMPRESSION: There is a 3 column fracture through C6 vertebral body extending to the posterior elements. 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  No acute intracranial process. Soft tissue injury overlying the left posterior calvarium.  Marked parenchymal atrophy more focal to the temporal lobes.  Biapical pulmonary nodularity measuring up to 7 mm. If the patient is at high risk for bronchogenic carcinoma, follow-up chest CT at 3-21months is recommended. If the patient is at low risk for bronchogenic carcinoma, follow-up chest CT at 6-12 months is recommended. This recommendation follows the consensus statement: Guidelines for Management of Small Pulmonary Nodules Detected on CT Scans: A Statement from the Fleischner Society as published in Radiology 2005; 237:395-400.  Critical Value/emergent results  were called by telephone at the time of interpretation on 05/12/2015 at 12:53 pm to Dr. Donnetta Hutching , who verbally acknowledged these results.   Electronically Signed   By: Annia Belt M.D.   On: 05/12/2015 13:04   Ct Cervical Spine Wo Contrast  05/12/2015   CLINICAL DATA:  Patient status post fall with large laceration on the back of the head. No reported loss of consciousness.  EXAM: CT HEAD WITHOUT CONTRAST  CT CERVICAL SPINE WITHOUT CONTRAST  TECHNIQUE: Multidetector CT imaging of the head and cervical spine was performed following the standard protocol without intravenous contrast. Multiplanar CT image reconstructions of the cervical spine were also generated.  COMPARISON:  None.  FINDINGS: CT HEAD FINDINGS  Ventricles and sulci are prominent compatible with atrophy. Periventricular and subcortical white matter hypodensity compatible with chronic small vessel ischemic  changes. Orbits are unremarkable. Paranasal sinuses are unremarkable. Mastoid air cells are well aerated. No displaced skull fracture. Soft tissue swelling and laceration overlying left posterior calvarium.  CT CERVICAL SPINE FINDINGS  Straightening of the normal cervical lordosis. 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 There is a 3 column fracture extending through C6 vertebral body extending to the posterior elements. No evidence for hematoma within the canal on CT imaging. Marked degenerative changes in the craniocervical junction. Lateral masses articulate appropriately with the dens. Biapical pulmonary nodularity measuring up to 7 mm within the left upper lobe.  IMPRESSION: There is a 3 column fracture through C6 vertebral body extending to the posterior elements. 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  No acute intracranial process. Soft tissue injury overlying the left posterior calvarium.  Marked parenchymal atrophy more focal to the temporal lobes.  Biapical pulmonary  nodularity measuring up to 7 mm. If the patient is at high risk for bronchogenic carcinoma, follow-up chest CT at 3-75months is recommended. If the patient is at low risk for bronchogenic carcinoma, follow-up chest CT at 6-12 months is recommended. This recommendation follows the consensus statement: Guidelines for Management of Small Pulmonary Nodules Detected on CT Scans: A Statement from the Fleischner Society as published in Radiology 2005; 237:395-400.  Critical Value/emergent results were called by telephone at the time of interpretation on 05/12/2015 at 12:53 pm to Dr. Donnetta Hutching , who verbally acknowledged these results.   Electronically Signed   By: Annia Belt M.D.   On: 05/12/2015 13:04     CBC  Recent Labs Lab 05/12/15 1526 05/13/15 0436  WBC 11.6* 7.4  HGB 12.2* 10.8*  HCT 37.3* 32.8*  PLT 191 173  MCV 96.9 97.9  MCH 31.7 32.2  MCHC 32.7 32.9  RDW 13.8 13.9  LYMPHSABS 1.6  --   MONOABS 0.9  --   EOSABS 0.0  --   BASOSABS 0.0  --     Chemistries   Recent Labs Lab 05/12/15 1526 05/13/15 0436  NA 140 140  K 4.2 4.5  CL 107 110  CO2 27 23  GLUCOSE 95 90  BUN 26* 21*  CREATININE 1.10 1.02  CALCIUM 8.5* 8.5*   ------------------------------------------------------------------------------------------------------------------ CrCl cannot be calculated (Unknown ideal weight.). ------------------------------------------------------------------------------------------------------------------ No results for input(s): HGBA1C in the last 72 hours. ------------------------------------------------------------------------------------------------------------------ No results for input(s): CHOL, HDL, LDLCALC, TRIG, CHOLHDL, LDLDIRECT in the last 72 hours. ------------------------------------------------------------------------------------------------------------------ No results for input(s): TSH, T4TOTAL, T3FREE, THYROIDAB in the last 72 hours.  Invalid input(s):  FREET3 ------------------------------------------------------------------------------------------------------------------ No results for input(s): VITAMINB12, FOLATE, FERRITIN, TIBC, IRON, RETICCTPCT in the last 72 hours.  Coagulation profile No results for input(s): INR, PROTIME in the last 168 hours.  No results for input(s): DDIMER in the last 72 hours.  Cardiac Enzymes No results for input(s): CKMB, TROPONINI, MYOGLOBIN in the last 168 hours.  Invalid input(s): CK ------------------------------------------------------------------------------------------------------------------ Invalid input(s): POCBNP     Time Spent in minutes   30 minutes   Blasa Raisch M.D on 05/13/2015 at 1:39 PM  Between 7am to 7pm - Pager - [phone]  After 7pm go to [email or web address] - password [facility]  [facility]   Office  [phone]

## 2015-05-14 DIAGNOSIS — N390 Urinary tract infection, site not specified: Secondary | ICD-10-CM

## 2015-05-14 DIAGNOSIS — B952 Enterococcus as the cause of diseases classified elsewhere: Secondary | ICD-10-CM

## 2015-05-14 MED ORDER — MORPHINE SULFATE (PF) 2 MG/ML IV SOLN
2.0000 mg | Freq: Once | INTRAVENOUS | Status: AC
  Administered 2015-05-14: 2 mg via INTRAVENOUS
  Filled 2015-05-14: qty 1

## 2015-05-14 NOTE — Clinical Social Work Placement (Signed)
   CLINICAL SOCIAL WORK PLACEMENT  NOTE  Date:  05/14/2015  Patient Details  Name: George Anderson MRN: [id number] Date of Birth: [birth year]  Clinical Social Work is seeking post-discharge placement for this patient at the Skilled  Nursing Facility level of care (*CSW will initial, date and re-position this form in  chart as items are completed):  Yes   Patient/family provided with [facility] Clinical Social Work Department's list of facilities offering this level of care within the geographic area requested by the patient (or if unable, by the patient's family).  Yes   Patient/family informed of their freedom to choose among providers that offer the needed level of care, that participate in Medicare, Medicaid or managed care program needed by the patient, have an available bed and are willing to accept the patient.  Yes   Patient/family informed of [facility]'s ownership interest in [facility] and [facility], as well as of the fact that they are under no obligation to receive care at these facilities.  PASRR submitted to EDS on 05/14/15     PASRR number received on 05/14/15     Existing PASRR number confirmed on       FL2 transmitted to all facilities in geographic area requested by pt/family on 05/14/15     FL2 transmitted to all facilities within larger geographic area on       Patient informed that his/her managed care company has contracts with or will negotiate with certain facilities, including the following:            Patient/family informed of bed offers received.  Patient chooses bed at       Physician recommends and patient chooses bed at      Patient to be transferred to   on  .  Patient to be transferred to facility by       Patient family notified on   of transfer.  Name of family member notified:        PHYSICIAN       Additional Comment:    _______________________________________________ Cristobal Goldmann, LCSW 05/14/2015, 7:34  PM

## 2015-05-14 NOTE — Evaluation (Signed)
Occupational Therapy Evaluation & Discharge Patient Details Name: George Anderson MRN: [id number] DOB: Nov 20, 1925 Today's Date: 05/14/2015    History of Present Illness Patient is an 81 yo male admitted 05/12/15 following a fall with C6 fracture.  Patient in Aspen collar for conservative treatment.   PMH:  Dementia, CABG, HTN, arthritis   Clinical Impression   Pt admitted to hospital due to reason stated above. Pt currently with functional limitiations due to cognitive deficits and pain. Prior to admission pt required total assistance for ADL completion. Pt currently requires the same level of assistance previously stated due to severe cognitive deficits limiting pt's ability to engage in ADL. Upon arrival pt was lying in bed with wife feeding pt. Per pt's wife pt needs to return home at a supervision level of assistance. Pt will benefit from skilled OT at SNF to allow safe discharge home with wife. Will defer further OT to SNF.    Follow Up Recommendations  SNF    Equipment Recommendations       Recommendations for Other Services       Precautions / Restrictions Precautions Precautions: Fall;Cervical Required Braces or Orthoses: Cervical Brace Cervical Brace: Hard collar;At all times Restrictions Weight Bearing Restrictions: No      Mobility Bed Mobility   Transfers                      Balance                                     ADL Overall ADL's : Needs assistance/impaired                                       General ADL Comments: Pt is total assist for all aspects of ADLs     Vision     Perception     Praxis      Pertinent Vitals/Pain Pain Assessment: Faces Pain Score: 5  Faces Pain Scale: Hurts little more Pain Location: neck Pain Descriptors / Indicators: Grimacing;Guarding;Headache Pain Intervention(s): Repositioned;Patient requesting pain meds-RN notified     Hand Dominance Right   Extremity/Trunk  Assessment Upper Extremity Assessment Upper Extremity Assessment:  (Unable to accurately assess due to pain and cervical fx)   Lower Extremity Assessment Lower Extremity Assessment: Defer to PT evaluation   Cervical / Trunk Assessment Cervical / Trunk Assessment: Kyphotic;Other exceptions Cervical / Trunk Exceptions: Cervical collar in place.   Communication Communication Communication: HOH   Cognition Arousal/Alertness: Awake/alert Behavior During Therapy: Agitated Overall Cognitive Status: History of cognitive impairments - at baseline Area of Impairment: Memory;Safety/judgement;Awareness Orientation Level: Disoriented to;Place;Time;Situation Current Attention Level: Selective Memory: Decreased short-term memory   Safety/Judgement: Decreased awareness of safety;Decreased awareness of deficits   Problem Solving: Slow processing;Requires verbal cues General Comments: Unsure of patient's baseline level of dementia   General Comments       Exercises       Shoulder Instructions      Home Living Family/patient expects to be discharged to:: Private residence Living Arrangements: Spouse/significant other Available Help at Discharge: Family;Available 24 hours/day Type of Home: House Home Access: Level entry     Home Layout: One level     Bathroom Shower/Tub: Tub only   Firefighter: Standard Bathroom Accessibility: Yes How Accessible: Accessible via walker Home Equipment: Grab bars -  toilet;Cane - single point;Wheelchair - [employer] - 4 wheels          Prior Functioning/Environment Level of Independence: Needs assistance  Gait / Transfers Assistance Needed: Uses cane for ambulation ADL's / Homemaking Assistance Needed: Per pt's wife she assist pt's with ADLs/IADLs        OT Diagnosis: Generalized weakness;Cognitive deficits;Acute pain   OT Problem List: Decreased cognition;Decreased safety awareness;Decreased knowledge of precautions   OT  Treatment/Interventions:      OT Goals(Current goals can be found in the care plan section) Acute Rehab OT Goals Patient Stated Goal: Pt unable to state goal. Per pt's wife for pt to return home  OT Frequency:     Barriers to D/C:            Co-evaluation              End of Session Equipment Utilized During Treatment: Cervical collar  Activity Tolerance: Patient limited by pain;Treatment limited secondary to agitation Patient left: in bed;with call bell/phone within reach;with bed alarm set;with nursing/sitter in room;with family/visitor present   Time: 1310-1346 OT Time Calculation (min): 36 min Charges:  OT Evaluation $Initial OT Evaluation Tier I: 1 Procedure OT Treatments $Therapeutic Activity: 8-22 mins G-Codes:    Smiley Houseman 2015-06-06, 3:09 PM

## 2015-05-14 NOTE — Clinical Social Work Note (Signed)
Clinical Social Work Assessment  Patient Details  Name: George Anderson MRN: [id number] Date of Birth: 07-07-1926  Date of referral:  05/14/15               Reason for consult:  Facility Placement                Permission sought to share information with:  Family Supports Permission granted to share information::  Yes, Verbal Permission Granted (Patient oriented to self only. Talked with wife and daughter at bedside.)  Name::     Mrs. Knotek and daughter Fayrene Helper  Agency::     Relationship::  Wife and daughter  Solicitor Information:  Daughter - [phone]. Wife usually at the bedside.  Housing/Transportation Living arrangements for the past 2 months:  Single Family Home Source of Information:  Spouse, Adult Children Patient Interpreter Needed:  None Criminal Activity/Legal Involvement Pertinent to Current Situation/Hospitalization:  No - Comment as needed Significant Relationships:  Adult Children, Spouse Lives with:  Spouse Do you feel safe going back to the place where you live?  Yes (Patient's home environment safe, however patient weak and needs rehab before going home.) Need for family participation in patient care:  Yes (Comment)  Care giving concerns:  Daughter expressed that patient is weak and cannot be cared for at home at discharge.   Social Worker assessment / plan:  CSW talked with patient's wife and daughter regarding discharge planning and ST rehab at a skilled facility. Patient was lying in bed and was awake, calm and verbal, but confused. CSW explained skilled facility search to family and provided SNF list for [facility] and questions were answered. Family's preference is [facility] and daughter plans to visit [facility] to determine if this will be their second choice.  Employment status:  Retired Engineer, mining, Games developer PT Recommendations:  Skilled Nursing Facility Information / Referral to community resources:  Skilled Nursing  Facility (Family given SNF list)  Patient/Family's Response to care:  No concerns expressed regarding care during this hospitalization.  Patient/Family's Understanding of and Emotional Response to Diagnosis, Current Treatment, and Prognosis:  Not discussed.  Emotional Assessment Appearance:  Appears stated age Attitude/Demeanor/Rapport:  Other (Patient talkative but confused.) Affect (typically observed):  Calm Orientation:  Oriented to Self Alcohol / Substance use:  Tobacco Use (Patient has quit smoking. He does not drink or use illicit drugs.) Psych involvement (Current and /or in the community):  No (Comment)  Discharge Needs  Concerns to be addressed:  Discharge Planning Concerns Readmission within the last 30 days:  No Current discharge risk:  None Barriers to Discharge:  No Barriers Identified   Cristobal Goldmann, LCSW 05/14/2015, 7:19 PM

## 2015-05-14 NOTE — Evaluation (Signed)
Physical Therapy Evaluation Patient Details Name: George Anderson MRN: [id number] DOB: [birth year] Today's Date: 05/14/2015   History of Present Illness  Patient is an 81 yo male admitted 05/12/15 following a fall with C6 fracture.  Patient in Aspen collar for conservative treatment.   PMH:  Dementia, CABG, HTN, arthritis  Clinical Impression  Patient presents with problems listed below.  Will benefit from acute PT to maximize functional mobility prior to discharge.  Patient was able to ambulate with cane pta.  Today, required +2 asist to sit EOB.  Recommend ST-SNF for continued therapy at discharge.  Patient will need to reach supervision level to return home with wife.    Follow Up Recommendations SNF;Supervision/Assistance - 24 hour    Equipment Recommendations  None recommended by PT    Recommendations for Other Services       Precautions / Restrictions Precautions Precautions: Fall;Cervical Required Braces or Orthoses: Cervical Brace Cervical Brace: Hard collar;At all times Restrictions Weight Bearing Restrictions: No      Mobility  Bed Mobility Overal bed mobility: Needs Assistance;+2 for physical assistance Bed Mobility: Supine to Sit;Sit to Supine     Supine to sit: Max assist;+2 for physical assistance Sit to supine: Max assist;+2 for physical assistance   General bed mobility comments: Verbal and tactile cues for mobility.  Patient did not initiate movement.  Required +2 assist to move LE's off of bed and raise trunk to sitting position.  Patient yelled out with pain.  Patient pushing back, attempting to return to supine.  Distracted patient with UE/LE movement requests.  Patient able to sit EOB x 5 minutes with min to min guard assist for balance.  Returned to supine with +2 max assist.  Transfers                    Ambulation/Gait                Stairs            Wheelchair Mobility    Modified Rankin (Stroke Patients Only)        Balance Overall balance assessment: Needs assistance Sitting-balance support: Single extremity supported;Feet supported Sitting balance-Leahy Scale: Poor Sitting balance - Comments: Required min to min guard assist for sitting balance.                                       Pertinent Vitals/Pain Pain Assessment: Faces Faces Pain Scale: Hurts even more Pain Location: Neck and back Pain Descriptors / Indicators: Grimacing;Guarding;Moaning (Yelling out with transition to sitting) Pain Intervention(s): Limited activity within patient's tolerance;Repositioned    Home Living Family/patient expects to be discharged to:: Skilled Nursing Facility Living Arrangements: Spouse/significant other Available Help at Discharge: Family;Available 24 hours/day (wife with limited ability to provide physical assist. Type of Home: House       Home Layout: One level Home Equipment: Grab bars - toilet;Cane - single point;Wheelchair - [employer] - 4 wheels      Prior Function Level of Independence: Needs assistance   Gait / Transfers Assistance Needed: Uses cane for ambulation  ADL's / Homemaking Assistance Needed: Per pt's wife she assist pt's with ADLs/IADLs        Hand Dominance   Dominant Hand: Right    Extremity/Trunk Assessment   Upper Extremity Assessment: Defer to OT evaluation           Lower Extremity Assessment:  Generalized weakness      Cervical / Trunk Assessment: Kyphotic;Other exceptions  Communication   Communication: HOH  Cognition Arousal/Alertness: Awake/alert Behavior During Therapy: Agitated;Restless Overall Cognitive Status: No family/caregiver present to determine baseline cognitive functioning Area of Impairment: Orientation;Attention;Memory;Safety/judgement;Problem solving Orientation Level: Disoriented to;Place;Time;Situation Current Attention Level: Focused Memory: Decreased short-term memory   Safety/Judgement: Decreased awareness  of deficits;Decreased awareness of safety   Problem Solving: Slow processing;Difficulty sequencing;Requires verbal cues General Comments: Unsure of patient's baseline level of dementia    General Comments      Exercises        Assessment/Plan    PT Assessment Patient needs continued PT services  PT Diagnosis Difficulty walking;Generalized weakness;Acute pain;Altered mental status   PT Problem List Decreased strength;Decreased activity tolerance;Decreased balance;Decreased mobility;Decreased cognition;Decreased safety awareness;Pain  PT Treatment Interventions DME instruction;Gait training;Functional mobility training;Therapeutic activities;Cognitive remediation;Patient/family education   PT Goals (Current goals can be found in the Care Plan section) Acute Rehab PT Goals Patient Stated Goal: Patient unable to state.  Wife:  safe d/c plan PT Goal Formulation: With patient/family Time For Goal Achievement: 05/28/15 Potential to Achieve Goals: Good    Frequency Min 3X/week   Barriers to discharge Decreased caregiver support Do not feel wife can provide patient with level of assist currently needed.    Co-evaluation               End of Session Equipment Utilized During Treatment: Cervical collar Activity Tolerance: Patient limited by pain;Treatment limited secondary to agitation Patient left: in bed;with call bell/phone within reach;with bed alarm set Nurse Communication: Mobility status         Time: 1610-9604 PT Time Calculation (min) (ACUTE ONLY): 18 min   Charges:   PT Evaluation $Initial PT Evaluation Tier I: 1 Procedure     PT G CodesVena Austria 2015/05/21, 1:31 PM Durenda Hurt. Renaldo Fiddler, [facility] Acute Rehab Services Pager [phone]

## 2015-05-14 NOTE — Progress Notes (Signed)
Utilization review completed. Emerald Shor, RN, BSN. 

## 2015-05-14 NOTE — Progress Notes (Signed)
Patient Demographics  George Anderson, is a 81 y.o. male, DOB - [birth year], ZOX:[id number]  Admit date - 05/12/2015   Admitting Physician Zannie Cove, MD  Outpatient Primary MD for the patient is George Inch, MD  LOS - 2   Chief Complaint  Patient presents with  . Fall  . Head Laceration         Subjective:   Illene Bolus today has, No headache, No chest pain, No abdominal pain - No Nausea, No Cough - SOB.   Assessment & Plan    Principal Problem:   Cervical spine fracture Active Problems:   Fall   Dementia   Lung nodule   UTI (lower urinary tract infection)  Cervical spine fracture -Seen by neurosurgery Dr. Jordan Likes recommended Aspen collar, conservative management ,Follow-up with Dr. Jordan Likes in 4 weeks - When necessary pain medicine -Physical therapy and OT evaluation recommended SNF placement.  Urinary tract infection - treated with IV Rocephin, urine culture growing enterococcus species, wilfollow on sensitivity   Dementia with history of sundowning and behavioral problems -Continue home regimen of Namenda, and rivastigmine  , Depakote and Xanax at bedtime  Lung nodules -Needs outpatient  follow-up,  will need repeat CT chest in 6 mon th.  Scalp laceration -Local wound care   Fall  -Unable to rule out syncope ,  - EKG nonacute -Denies any chest pain or any other cardio pulmonary symptoms,  - no significant events on telemetry   Status: DNR Family Communication: spoke with daughter over the phone. Disposition Plan: SNF in 24 hours   Procedures None  Consults   Neurosurgery   Medications  Scheduled Meds: . cefTRIAXone (ROCEPHIN)  IV  1 g Intravenous Q24H  . divalproex  125 mg Oral BID  . enoxaparin (LOVENOX) injection  30 mg Subcutaneous Q24H  . loratadine  10 mg Oral Daily  . memantine  28 mg Oral Daily  . Rivastigmine  13.3 mg Transdermal Daily    Continuous Infusions: . sodium chloride 75 mL/hr at 05/13/15 1649   PRN Meds:.acetaminophen, ALPRAZolam, ondansetron **OR** ondansetron (ZOFRAN) IV, traMADol  DVT Prophylaxis  Lovenox   Lab Results  Component Value Date   PLT 173 05/13/2015    Antibiotics    Anti-infectives    Start     Dose/Rate Route Frequency Ordered Stop   05/12/15 1800  cefTRIAXone (ROCEPHIN) 1 g in dextrose 5 % 50 mL IVPB     1 g 100 mL/hr over 30 Minutes Intravenous Every 24 hours 05/12/15 1748            Objective:   Filed Vitals:   05/13/15 1710 05/13/15 2036 05/14/15 0623 05/14/15 0829  BP: 151/76 160/69 171/93 111/82  Pulse: 73 76 97 87  Temp: 98 F (36.7 C) 98.1 F (36.7 C) 97.7 F (36.5 C) 97.8 F (36.6 C)  TempSrc: Oral Oral Oral Oral  Resp: Weight:  58.287 kg (128 lb 8 oz)    SpO2: 98% 98% 98% 95%    Wt Readings from Last 3 Encounters:  05/13/15 58.287 kg (128 lb 8 oz)     Intake/Output Summary (Last 24 hours) at 05/14/15 1601 Last data filed at 05/14/15 1312  Gross per 24 hour  Intake    160 ml  Output   1550 ml  Net  -1390 ml     Physical Exam  Awake Alert, confused, laceration and right scalp with staples. Aspen collar,No JVD,  Symmetrical Chest wall movement, Good air movement bilaterally,  RRR,No Gallops, No Parasternal Heave +ve B.Sounds, Abd Soft, No tenderness, No organomegaly appriciated, No rebound - guarding or rigidity. No Cyanosis, Clubbing or edema,   Data Review   Micro Results Recent Results (from the past 240 hour(s))  Culture, Urine     Status: None (Preliminary result)   Collection Time: 05/12/15  4:30 PM  Result Value Ref Range Status   Specimen Description URINE, RANDOM  Final   Special Requests NONE  Final   Culture >=100,000 COLONIES/mL ENTEROCOCCUS SPECIES  Final   Report Status PENDING  Incomplete    Radiology Reports Ct Head Wo Contrast  05/12/2015   CLINICAL DATA:  Patient status post fall with large laceration on  the back of the head. No reported loss of consciousness.  EXAM: CT HEAD WITHOUT CONTRAST  CT CERVICAL SPINE WITHOUT CONTRAST  TECHNIQUE: Multidetector CT imaging of the head and cervical spine was performed following the standard protocol without intravenous contrast. Multiplanar CT image reconstructions of the cervical spine were also generated.  COMPARISON:  None.  FINDINGS: CT HEAD FINDINGS  Ventricles and sulci are prominent compatible with atrophy. Periventricular and subcortical white matter hypodensity compatible with chronic small vessel ischemic changes. Orbits are unremarkable. Paranasal sinuses are unremarkable. Mastoid air cells are well aerated. No displaced skull fracture. Soft tissue swelling and laceration overlying left posterior calvarium.  CT CERVICAL SPINE FINDINGS  Straightening of the normal cervical lordosis. 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 There is a 3 column fracture extending through C6 vertebral body extending to the posterior elements. No evidence for hematoma within the canal on CT imaging. Marked degenerative changes in the craniocervical junction. Lateral masses articulate appropriately with the dens. Biapical pulmonary nodularity measuring up to 7 mm within the left upper lobe.  IMPRESSION: There is a 3 column fracture through C6 vertebral body extending to the posterior elements. 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  No acute intracranial process. Soft tissue injury overlying the left posterior calvarium.  Marked parenchymal atrophy more focal to the temporal lobes.  Biapical pulmonary nodularity measuring up to 7 mm. If the patient is at high risk for bronchogenic carcinoma, follow-up chest CT at 3-70months is recommended. If the patient is at low risk for bronchogenic carcinoma, follow-up chest CT at 6-12 months is recommended. This recommendation follows the consensus statement: Guidelines for Management of  Small Pulmonary Nodules Detected on CT Scans: A Statement from the Fleischner Society as published in Radiology 2005; 237:395-400.  Critical Value/emergent results were called by telephone at the time of interpretation on 05/12/2015 at 12:53 pm to Dr. Donnetta Hutching , who verbally acknowledged these results.   Electronically Signed   By: Annia Belt M.D.   On: 05/12/2015 13:04   Ct Cervical Spine Wo Contrast  05/12/2015   CLINICAL DATA:  Patient status post fall with large laceration on the back of the head. No reported loss of consciousness.  EXAM: CT HEAD WITHOUT CONTRAST  CT CERVICAL SPINE WITHOUT CONTRAST  TECHNIQUE: Multidetector CT imaging of the head and cervical spine was performed following the standard protocol without intravenous contrast. Multiplanar CT image reconstructions of the cervical spine were also generated.  COMPARISON:  None.  FINDINGS: CT  HEAD FINDINGS  Ventricles and sulci are prominent compatible with atrophy. Periventricular and subcortical white matter hypodensity compatible with chronic small vessel ischemic changes. Orbits are unremarkable. Paranasal sinuses are unremarkable. Mastoid air cells are well aerated. No displaced skull fracture. Soft tissue swelling and laceration overlying left posterior calvarium.  CT CERVICAL SPINE FINDINGS  Straightening of the normal cervical lordosis. 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 There is a 3 column fracture extending through C6 vertebral body extending to the posterior elements. No evidence for hematoma within the canal on CT imaging. Marked degenerative changes in the craniocervical junction. Lateral masses articulate appropriately with the dens. Biapical pulmonary nodularity measuring up to 7 mm within the left upper lobe.  IMPRESSION: There is a 3 column fracture through C6 vertebral body extending to the posterior elements. 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  No acute  intracranial process. Soft tissue injury overlying the left posterior calvarium.  Marked parenchymal atrophy more focal to the temporal lobes.  Biapical pulmonary nodularity measuring up to 7 mm. If the patient is at high risk for bronchogenic carcinoma, follow-up chest CT at 3-28months is recommended. If the patient is at low risk for bronchogenic carcinoma, follow-up chest CT at 6-12 months is recommended. This recommendation follows the consensus statement: Guidelines for Management of Small Pulmonary Nodules Detected on CT Scans: A Statement from the Fleischner Society as published in Radiology 2005; 237:395-400.  Critical Value/emergent results were called by telephone at the time of interpretation on 05/12/2015 at 12:53 pm to Dr. Donnetta Hutching , who verbally acknowledged these results.   Electronically Signed   By: Annia Belt M.D.   On: 05/12/2015 13:04     CBC  Recent Labs Lab 05/12/15 1526 05/13/15 0436  WBC 11.6* 7.4  HGB 12.2* 10.8*  HCT 37.3* 32.8*  PLT 191 173  MCV 96.9 97.9  MCH 31.7 32.2  MCHC 32.7 32.9  RDW 13.8 13.9  LYMPHSABS 1.6  --   MONOABS 0.9  --   EOSABS 0.0  --   BASOSABS 0.0  --     Chemistries   Recent Labs Lab 05/12/15 1526 05/13/15 0436  NA 140 140  K 4.2 4.5  CL 107 110  CO2 27 23  GLUCOSE 95 90  BUN 26* 21*  CREATININE 1.10 1.02  CALCIUM 8.5* 8.5*   ------------------------------------------------------------------------------------------------------------------ CrCl cannot be calculated (Unknown ideal weight.). ------------------------------------------------------------------------------------------------------------------ No results for input(s): HGBA1C in the last 72 hours. ------------------------------------------------------------------------------------------------------------------ No results for input(s): CHOL, HDL, LDLCALC, TRIG, CHOLHDL, LDLDIRECT in the last 72  hours. ------------------------------------------------------------------------------------------------------------------ No results for input(s): TSH, T4TOTAL, T3FREE, THYROIDAB in the last 72 hours.  Invalid input(s): FREET3 ------------------------------------------------------------------------------------------------------------------ No results for input(s): VITAMINB12, FOLATE, FERRITIN, TIBC, IRON, RETICCTPCT in the last 72 hours.  Coagulation profile No results for input(s): INR, PROTIME in the last 168 hours.  No results for input(s): DDIMER in the last 72 hours.  Cardiac Enzymes No results for input(s): CKMB, TROPONINI, MYOGLOBIN in the last 168 hours.  Invalid input(s): CK ------------------------------------------------------------------------------------------------------------------ Invalid input(s): POCBNP     Time Spent in minutes   30 minutes   Nakina Spatz M.D on 05/14/2015 at 4:01 PM  Between 7am to 7pm - Pager - [phone]  After 7pm go to [email or web address] - password [email or web address]  [facility]   Office  [phone]

## 2015-05-15 LAB — URINE CULTURE: Culture: >=100000

## 2015-05-15 MED ORDER — ALPRAZOLAM 0.25 MG PO TABS: 0.1250 mg | ORAL_TABLET | Freq: Every evening | ORAL | Status: AC | PRN

## 2015-05-15 MED ORDER — SODIUM CHLORIDE 0.9 % IV SOLN
2.0000 g | Freq: Once | INTRAVENOUS | Status: AC
  Administered 2015-05-15: 2 g via INTRAVENOUS
  Filled 2015-05-15: qty 2000

## 2015-05-15 MED ORDER — TRAMADOL HCL 50 MG PO TABS: 50.0000 mg | ORAL_TABLET | Freq: Two times a day (BID) | ORAL | Status: AC | PRN

## 2015-05-15 NOTE — Discharge Summary (Addendum)
George Anderson, is a 81 y.o. male  DOB 1926/01/10  MRN [id number].  Admission date:  05/12/2015  Admitting Physician  Zannie Cove, MD  Discharge Date:  05/15/2015   Primary MD  [name], MD   Recommendations for primary care physician for things to follow:  - check basic labs including CBC, BMP during next visit. - Continue with cervical Aspen collar all times until seen and cleared by neurosurgery, patient need to follow-up with Dr. Jordan Likes in 4 weeks. - Patient will need staples from a laceration removed in 10 days. - Patient will need repeat CT chest in 6 months to follow on pulmonary nodules  Addendum - Initially planned to discharge patient to SNF was changed as per daughter request, does not want patient to be discharged to SNF, wants him to be discharged home, discussed with daughter over the phone, wife at bedside, and they want to proceed with discharge to home,  case manager will arrange for home care including PT, OT, visiting nurse, aide, and social worker, and referral will be made for home hospice as per family request.  Addendum:  Admission Diagnosis  Fall, initial encounter [W19.XXXA] Laceration of scalp, initial encounter [S01.01XA] C6 cervical fracture, closed, initial encounter [S12.500A] Dementia, with behavioral disturbance [F03.91]   Discharge Diagnosis  Fall, initial encounter [W19.XXXA] Laceration of scalp, initial encounter [S01.01XA] C6 cervical fracture, closed, initial encounter [S12.500A] Dementia, with behavioral disturbance [F03.91]    Principal Problem:   Cervical spine fracture Active Problems:   Fall   Dementia   Lung nodule   UTI (urinary tract infection) due to Enterococcus      Past Medical History  Diagnosis Date  . Arthritis   . Hyperlipidemia   . Hypertension   . Dementia     History reviewed. No pertinent past surgical history.     History of present  illness and  Hospital Course:     Kindly see H&P for history of present illness and admission details, please review complete Labs, Consult reports and Test reports for all details in brief  HPI  from the history and physical done on the day of admission  George Anderson is a 81 y.o. male with past medical history of dementia, remote history of CABG, history of ankylosing spondylitis was brought to the ER by his wife with the above complaints. Patient goes to an adult dementia service program during the daytime and was getting ready to go there this morning. He was eating breakfast at the dining table his wife was in and underwent him getting dressed as well when she suddenly heard a Thud, she found him down on the floor in between the cabinets and he was bleeding from his scalp then. He was responsive when she found him and complained of neck pain. Subsequently EMS was called and he was brought to the emergency room where he was noted to have a C6 fracture he also had a scalp laceration with which was sutured using 3 staples. In addition he was diagnosed with a urinary tract infection yesterday when he was taken to his primary care doctor's office based on some  urinary discomfort/dysuria associated with malodorous and cloudy urine.  Hospital Course   Cervical spine fracture -Seen by neurosurgery Dr. Jordan Likes recommended Aspen collar, conservative management ,Follow-up with Dr. Jordan Likes in 4 weeks -Physical therapy and OT evaluation recommended SNF placement.  Urinary tract infection - Urine culture growing enterococcus, prior to that patient was treated with IV Rocephin, discussed with Dr. Orvan Falconer from ID, this is most likely contaminant, especially in the absence of UTI symptoms , with no fever or leukocytosis, no need for further antibiotics treatment as an outpatient.  Dementia with history of sundowning and behavioral problems -Continue home regimen of Namenda, and rivastigmine , Depakote and  Xanax at bedtime  Lung nodules -Needs outpatient follow-up, will need repeat CT chest in 6 month.  Scalp laceration -Local wound care, before meals staples in 10 days  Fall  -Unable to rule out syncope ,  - EKG nonacute -Denies any chest pain or any other cardio pulmonary symptoms,  - no significant events on telemetry during hospital stay (only occasional PVCs).    Discharge Condition:  stable   Follow UP  Follow-up Information    Follow up with BADGER,MICHAEL C, MD. Call in 1 week.   Specialty:  Family Medicine   Contact information:   [address] [place] [address] [phone]       Call Temple Pacini, MD.   Specialty:  Neurosurgery   Why:  For cervical spine fracture.   Contact information:   [address] [place] [address] [phone]         Discharge Instructions  and  Discharge Medications        Discharge Instructions    Diet - low sodium heart healthy    Complete by:  As directed      Discharge instructions    Complete by:  As directed   Follow with Primary MD [name], MD within one week. - Patient to continue with cervical Aspen collar until seen and cleared by neurosurgery. - Patient will need staples at left head laceration removed in 10 days. Get CBC, CMP, 2 view Chest X ray checked  by Primary MD next visit.    Activity: As tolerated with Full fall precautions use walker/cane & assistance as needed   Disposition home with home care.   Diet: Heart Healthy  , with feeding assistance and aspiration precautions.  For Heart failure patients - Check your Weight same time everyday, if you gain over 2 pounds, or you develop in leg swelling, experience more shortness of breath or chest pain, call your Primary MD immediately. Follow Cardiac Low Salt Diet and 1.5 lit/day fluid restriction.   On your next visit with your primary care physician please Get Medicines reviewed and adjusted.   Please  request your Prim.MD to go over all Hospital Tests and Procedure/Radiological results at the follow up, please get all Hospital records sent to your Prim MD by signing hospital release before you go home.   If you experience worsening of your admission symptoms, develop shortness of breath, life threatening emergency, suicidal or homicidal thoughts you must seek medical attention immediately by calling 911 or calling your MD immediately  if symptoms less severe.  You Must read complete instructions/literature along with all the possible adverse reactions/side effects for all the Medicines you take and that have been prescribed to you. Take any new Medicines after you have completely understood and accpet all the possible adverse reactions/side effects.   Do not drive,  operating heavy machinery, perform activities at heights, swimming or participation in water activities or provide baby sitting services if your were admitted for syncope or siezures until you have seen by Primary MD or a Neurologist and advised to do so again.  Do not drive when taking Pain medications.    Do not take more than prescribed Pain, Sleep and Anxiety Medications  Special Instructions: If you have smoked or chewed Tobacco  in the last 2 yrs please stop smoking, stop any regular Alcohol  and or any Recreational drug use.  Wear Seat belts while driving.   Please note  You were cared for by a hospitalist during your hospital stay. If you have any questions about your discharge medications or the care you received while you were in the hospital after you are discharged, you can call the unit and asked to speak with the hospitalist on call if the hospitalist that took care of you is not available. Once you are discharged, your primary care physician will handle any further medical issues. Please note that NO REFILLS for any discharge medications will be authorized once you are discharged, as it is imperative that you return  to your primary care physician (or establish a relationship with a primary care physician if you do not have one) for your aftercare needs so that they can reassess your need for medications and monitor your lab values.     Increase activity slowly    Complete by:  As directed             Medication List    TAKE these medications        ALPRAZolam 0.25 MG tablet  Commonly known as:  XANAX  Take 0.5 tablets (0.125 mg total) by mouth at bedtime as needed for anxiety.     amLODipine-benazepril 5-40 MG per capsule  Commonly known as:  LOTREL     atorvastatin 40 MG tablet  Commonly known as:  LIPITOR     divalproex 125 MG capsule  Commonly known as:  DEPAKOTE SPRINKLE  Take 125 mg by mouth 2 (two) times daily.     loratadine 10 MG tablet  Commonly known as:  CLARITIN  Take 10 mg by mouth daily.     NAMENDA XR 28 MG Cp24 24 hr capsule  Generic drug:  memantine     Rivastigmine 13.3 MG/24HR Pt24     traMADol 50 MG tablet  Commonly known as:  ULTRAM  Take 1 tablet (50 mg total) by mouth every 12 (twelve) hours as needed for severe pain.          Diet and Activity recommendation: See Discharge Instructions above   Consults obtained -  Neurosurgery   Major procedures and Radiology Reports - PLEASE review detailed and final reports for all details, in brief -   None   Ct Head Wo Contrast  05/12/2015   CLINICAL DATA:  Patient status post fall with large laceration on the back of the head. No reported loss of consciousness.  EXAM: CT HEAD WITHOUT CONTRAST  CT CERVICAL SPINE WITHOUT CONTRAST  TECHNIQUE: Multidetector CT imaging of the head and cervical spine was performed following the standard protocol without intravenous contrast. Multiplanar CT image reconstructions of the cervical spine were also generated.  COMPARISON:  None.  FINDINGS: CT HEAD FINDINGS  Ventricles and sulci are prominent compatible with atrophy. Periventricular and subcortical white matter hypodensity  compatible with chronic small vessel ischemic changes. Orbits are unremarkable. Paranasal sinuses are unremarkable.  Mastoid air cells are well aerated. No displaced skull fracture. Soft tissue swelling and laceration overlying left posterior calvarium.  CT CERVICAL SPINE FINDINGS  Straightening of the normal cervical lordosis. 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 There is a 3 column fracture extending through C6 vertebral body extending to the posterior elements. No evidence for hematoma within the canal on CT imaging. Marked degenerative changes in the craniocervical junction. Lateral masses articulate appropriately with the dens. Biapical pulmonary nodularity measuring up to 7 mm within the left upper lobe.  IMPRESSION: There is a 3 column fracture through C6 vertebral body extending to the posterior elements. 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  No acute intracranial process. Soft tissue injury overlying the left posterior calvarium.  Marked parenchymal atrophy more focal to the temporal lobes.  Biapical pulmonary nodularity measuring up to 7 mm. If the patient is at high risk for bronchogenic carcinoma, follow-up chest CT at 3-28months is recommended. If the patient is at low risk for bronchogenic carcinoma, follow-up chest CT at 6-12 months is recommended. This recommendation follows the consensus statement: Guidelines for Management of Small Pulmonary Nodules Detected on CT Scans: A Statement from the Fleischner Society as published in Radiology 2005; 237:395-400.  Critical Value/emergent results were called by telephone at the time of interpretation on 05/12/2015 at 12:53 pm to Dr. Donnetta Hutching , who verbally acknowledged these results.   Electronically Signed   By: Annia Belt M.D.   On: 05/12/2015 13:04   Ct Cervical Spine Wo Contrast  05/12/2015   CLINICAL DATA:  Patient status post fall with large laceration on the back of the head. No  reported loss of consciousness.  EXAM: CT HEAD WITHOUT CONTRAST  CT CERVICAL SPINE WITHOUT CONTRAST  TECHNIQUE: Multidetector CT imaging of the head and cervical spine was performed following the standard protocol without intravenous contrast. Multiplanar CT image reconstructions of the cervical spine were also generated.  COMPARISON:  None.  FINDINGS: CT HEAD FINDINGS  Ventricles and sulci are prominent compatible with atrophy. Periventricular and subcortical white matter hypodensity compatible with chronic small vessel ischemic changes. Orbits are unremarkable. Paranasal sinuses are unremarkable. Mastoid air cells are well aerated. No displaced skull fracture. Soft tissue swelling and laceration overlying left posterior calvarium.  CT CERVICAL SPINE FINDINGS  Straightening of the normal cervical lordosis. 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 There is a 3 column fracture extending through C6 vertebral body extending to the posterior elements. No evidence for hematoma within the canal on CT imaging. Marked degenerative changes in the craniocervical junction. Lateral masses articulate appropriately with the dens. Biapical pulmonary nodularity measuring up to 7 mm within the left upper lobe.  IMPRESSION: There is a 3 column fracture through C6 vertebral body extending to the posterior elements. 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  No acute intracranial process. Soft tissue injury overlying the left posterior calvarium.  Marked parenchymal atrophy more focal to the temporal lobes.  Biapical pulmonary nodularity measuring up to 7 mm. If the patient is at high risk for bronchogenic carcinoma, follow-up chest CT at 3-6months is recommended. If the patient is at low risk for bronchogenic carcinoma, follow-up chest CT at 6-12 months is recommended. This recommendation follows the consensus statement: Guidelines for Management of Small Pulmonary Nodules  Detected on CT Scans: A Statement from the Fleischner Society as published in Radiology 2005; 237:395-400.  Critical Value/emergent results were called by telephone at the time  of interpretation on 05/12/2015 at 12:53 pm to Dr. Donnetta Hutching , who verbally acknowledged these results.   Electronically Signed   By: Annia Belt M.D.   On: 05/12/2015 13:04    Micro Results     Recent Results (from the past 240 hour(s))  Culture, Urine     Status: None   Collection Time: 05/12/15  4:30 PM  Result Value Ref Range Status   Specimen Description URINE, RANDOM  Final   Special Requests NONE  Final   Culture >=100,000 COLONIES/mL ENTEROCOCCUS SPECIES  Final   Report Status 05/15/2015 FINAL  Final   Organism ID, Bacteria ENTEROCOCCUS SPECIES  Final      Susceptibility   Enterococcus species - MIC*    AMPICILLIN <=2 SENSITIVE Sensitive     LEVOFLOXACIN 1 SENSITIVE Sensitive     NITROFURANTOIN <=16 SENSITIVE Sensitive     VANCOMYCIN 1 SENSITIVE Sensitive     * >=100,000 COLONIES/mL ENTEROCOCCUS SPECIES       Today   Subjective:   Illene Bolus today has no headache,no chest or abdominal pain,no new weakness tingling or numbness, has no complaints  Objective:   Blood pressure 170/85, pulse 80, temperature 97.6 F (36.4 C), temperature source Oral, resp. rate 17, weight 58.016 kg (127 lb 14.4 oz), SpO2 98 %.   Intake/Output Summary (Last 24 hours) at 05/15/15 1630 Last data filed at 05/15/15 1331  Gross per 24 hour  Intake   1340 ml  Output   1200 ml  Net    140 ml    Exam Awake Alert, confused, laceration and right scalp with staples. Aspen collar,No JVD,  Symmetrical Chest wall movement, Good air movement bilaterally,  RRR,No Gallops, No Parasternal Heave +ve B.Sounds, Abd Soft, No tenderness, No organomegaly appriciated, No rebound - guarding or rigidity. No Cyanosis, Clubbing or edema  Data Review   CBC w Diff:  Lab Results  Component Value Date   WBC 7.4 05/13/2015    HGB 10.8* 05/13/2015   HCT 32.8* 05/13/2015   PLT 173 05/13/2015   LYMPHOPCT 13 05/12/2015   MONOPCT 8 05/12/2015   EOSPCT 0 05/12/2015   BASOPCT 0 05/12/2015    CMP:  Lab Results  Component Value Date   NA 140 05/13/2015   K 4.5 05/13/2015   CL 110 05/13/2015   CO2 23 05/13/2015   BUN 21* 05/13/2015   CREATININE 1.02 05/13/2015  .   Total Time in preparing paper work, data evaluation and todays exam - 35 minutes  Saide Lanuza M.D on 05/15/2015 at 4:30 PM  [facility]   Office  [phone]

## 2015-05-15 NOTE — Progress Notes (Signed)
Patient discharged home,transported by [facility].Daughter and wife with patient. [place], Drinda Butts, Charity fundraiser

## 2015-05-15 NOTE — Progress Notes (Signed)
Initial Nutrition Assessment  DOCUMENTATION CODES:   Not applicable  INTERVENTION:   Encouraged consumption of Ensure post discharge especially if po intake is poor.    NUTRITION DIAGNOSIS:   Increased nutrient needs related to acute illness as evidenced by estimated needs.  GOAL:   Patient will meet greater than or equal to 90% of their needs  MONITOR:   PO intake, Weight trends, Labs, I & O's  REASON FOR ASSESSMENT:   Low Braden    ASSESSMENT:   81 y.o. male with past medical history of dementia, remote history of CABG, history of ankylosing spondylitis was brought to the ER by his wife after fall. Pt noted to have a C6 fracture he also had a scalp laceration with which was sutured using 3 staples.  Meal completion has been 25-100%. Pt reports appetite is good currently and PTA with consumption of 3 meals a day and no other difficulties. Usual body weight reported to be ~138 lbs which patient reports last weighing 1 month ago. Pt with a 7.9% weight loss in 1 month. Wife at bedside, reports the weight loss has been mostly intentional. Plans for discharge today. Pt was encouraged to consume Ensure at home if appetite/po intake is poor.   Nutrition-Focused physical exam completed. Findings are no fat depletion, moderate muscle depletion, and mild edema.   Labs and medications reviewed  Diet Order:  Diet - low sodium heart healthyRegular diet  Skin:   (Incision on head, generalized edema)  Last BM:  9/14  Height:   Ht Readings from Last 1 Encounters:  No data found for Ht  Pt reports 5 feet 9 inches.  Weight:   Wt Readings from Last 1 Encounters:  05/14/15 127 lb 14.4 oz (58.016 kg)    Ideal Body Weight:  72.7 kg  BMI:  Body Mass Index: 18.9 kg/(m^2)  Estimated Nutritional Needs:   Kcal:  1700-1900  Protein:  75-85 grams   Fluid:  1.7 - 1.9 L/day  EDUCATION NEEDS:   No education needs identified at this time  Roslyn Smiling, MS, RD, LDN Pager #  [phone] After hours/ weekend pager # [phone]

## 2015-05-15 NOTE — Progress Notes (Signed)
LCSW called patient daughter per wife request.  Daughter called and reports she is out looking at SNFs with first choice being [facility], [facility], or [facility].  Daughter informed no bed offer from any of the above facilities. Daughter reports frustration as only bed offers are from [facility].  Daughter reports she is now rethinking her decision about SNF and wanting to take him home with home health.  She reports she does not want him in facilities listed as she has had experience with those facilities with her father in law.  She reports patient's wife will be home with patient, she will be involved in care and interested in private sitter list in case more help is needed for short time. Daughter also interested if patient would qualify for hospice in the home.  Call placed to CM: Elnita Maxwell who will call the daughter and give information and what patient qualifies for.  No other needs for CSW at this time.    Deretha Emory, MSW Clinical Social Work: Emergency Room [phone]

## 2015-05-15 NOTE — Care Management Note (Addendum)
Case Management Note  Patient Details  Name: George Anderson MRN: [id number] Date of Birth: 1926-08-15  Subjective/Objective:            CM following for progression and d/c planning.        Action/Plan: 05/15/2015 Spoke with pt daughter,, Fayrene Helper who does not wish to d/c her father to any of the SNF which were offered. She is requesting [facility] services with additional services if available. This CM contacted [facility] at her request and they can start HHPT this weekend, we have also requested HHRN , HHOT, HH aide and Child psychotherapist. I have asked Genev[facility] Norlander rep to contact Ms Juanell Fairly re number of visits etc. Ms Juanell Fairly also indicated the family may wish to arrange additional services in the home ,so a list of private duty agencies was given to the pt wife. There are no DME needs per Ms Juanell Fairly, as the pt has walkers, 3:1. Tub seat and other pieces of DME. Ms Juanell Fairly also requested that her father be evaluated for Home Hospice services, this CM ask the pt attending Dr Randol Kern to order a Home Hospice services eval. This requested was called to [facility] and may take place in the pt home as the pt plan is to d/c to home today and has [facility] services arranged. Pt will be d/c to home via ambulance.  Per discussion with PT , this pt is very difficult to manager and required PT and PT assist to get to a sitting position, therefore 2+ assist required, the pt family is aware of this need and still plan to d/c to home.  5pm received return call from Hospice RN liason , Kennyth Arnold, and she will facilitate a in home hospice eval of this pt and attempt to reach the family the first of next week to schedule.   Expected Discharge Date:     05/15/2015             Expected Discharge Plan:  Home w Home Health Services  In-House Referral:  NA  Discharge planning Services  CM Consult  Post Acute Care Choice:    Choice offered to:     DME Arranged:    DME Agency:     HH Arranged:  RN, PT, OT, Nurse's  Aide, Social Work [employer] Agency:  [facility]  Status of Service:  Completed, signed off  Medicare Important Message Given:  Yes-second notification given Date Medicare IM Given:    Medicare IM give by:    Date Additional Medicare IM Given:    Additional Medicare Important Message give by:     If discussed at Long Length of Stay Meetings, dates discussed:    Additional Comments:  Starlyn Skeans, RN 05/15/2015, 2:19 PM

## 2015-05-15 NOTE — Progress Notes (Signed)
Paged MD as per patient's wife request.Wife wants to talk to MD. Jayme Cloud, RN

## 2015-05-15 NOTE — Progress Notes (Signed)
Physical Therapy Treatment Patient Details Name: George Anderson MRN: [id number] DOB: [birth year] Today's Date: 05/15/2015    History of Present Illness Patient is an 81 yo male admitted 05/12/15 following a fall with C6 fracture.  Patient in Aspen collar for conservative treatment.   PMH:  Dementia, CABG, HTN, arthritis    PT Comments    Patient requiring +2 max assist for all mobility and transfers.  Unable to ambulate at this point.  Patient limited by pain and decreased cognition.  Continue to recommend SNF at discharge with goal to reach supervision level to enable patient to safely return home with wife.   Follow Up Recommendations  SNF;Supervision/Assistance - 24 hour     Equipment Recommendations  None recommended by PT    Recommendations for Other Services       Precautions / Restrictions Precautions Precautions: Fall;Cervical Required Braces or Orthoses: Cervical Brace Cervical Brace: Hard collar;At all times Restrictions Weight Bearing Restrictions: No    Mobility  Bed Mobility Overal bed mobility: Needs Assistance;+2 for physical assistance Bed Mobility: Rolling;Sidelying to Sit Rolling: Max assist;+2 for physical assistance Sidelying to sit: Max assist;+2 for physical assistance       General bed mobility comments: Verbal and tactile cues for technique.  Patient allowed increased time to follow cues - did not.  Used bed pad to assist patient to roll on to side - patient yelling with discomfort.  Required max assist to move LE's off of bed, and to raise trunk to sitting position.  Initially, patient required assist to maintain balance, with posterior lean.  Transfers Overall transfer level: Needs assistance Equipment used: 2 person hand held assist Transfers: Sit to/from [employer] Sit to Stand: Max assist;+2 physical assistance Stand pivot transfers: Max assist;+2 physical assistance       General transfer comment: Repeated cues to move to  standing - counted to 3 to help initiate movement.  On 3, patient lifting feet off of floor.  Required 4 attempts before able to stand with max +2 assist.  Unable to reach upright stance position.  Required +2 max - total assist to take a few shuffle steps to pivot to chair.  Ambulation/Gait             General Gait Details: Unable   Stairs            Wheelchair Mobility    Modified Rankin (Stroke Patients Only)       Balance Overall balance assessment: Needs assistance         Standing balance support: Bilateral upper extremity supported Standing balance-Leahy Scale: Poor                      Cognition Arousal/Alertness: Awake/alert Behavior During Therapy: Anxious;Agitated Overall Cognitive Status: Impaired/Different from baseline (Wife present for session - states different from baseline) Area of Impairment: Orientation;Attention;Memory;Following commands;Safety/judgement;Awareness;Problem solving Orientation Level: Person Current Attention Level: Focused Memory: Decreased short-term memory Following Commands: Follows one step commands inconsistently;Follows one step commands with increased time (Great difficulty following commands today) Safety/Judgement: Decreased awareness of deficits;Decreased awareness of safety   Problem Solving: Slow processing;Decreased initiation;Difficulty sequencing;Requires verbal cues;Requires tactile cues General Comments: Per wife, patient was much more oriented at home.  Today having difficulty answering questions and following commands.    Exercises      General Comments        Pertinent Vitals/Pain Pain Assessment: Faces Faces Pain Scale: Hurts whole lot Pain Location: Neck and back Pain Descriptors /  Indicators: Grimacing;Guarding;Moaning (Yellnig out with mobility) Pain Intervention(s): Limited activity within patient's tolerance;Premedicated before session;Repositioned    Home Living                       Prior Function            PT Goals (current goals can now be found in the care plan section) Progress towards PT goals: Progressing toward goals    Frequency  Min 3X/week    PT Plan Current plan remains appropriate    Co-evaluation             End of Session Equipment Utilized During Treatment: Gait belt;Cervical collar Activity Tolerance: Patient limited by pain (Limited by cognition) Patient left: in chair;with call bell/phone within reach;with family/visitor present     Time: 6387-5643 PT Time Calculation (min) (ACUTE ONLY): 33 min  Charges:  $Therapeutic Activity: 23-37 mins                    G Codes:      Vena Austria 06/07/15, 3:47 PM Durenda Hurt. Renaldo Fiddler, [facility] Pager [phone]

## 2015-05-15 NOTE — Discharge Instructions (Signed)
FOLLOW with Primary MD [name], MD : 7 DAYS. - Patient to continue with cervical Aspen collar until seen and cleared by neurosurgery. - Patient will need staples at left head laceration removed in 10 days.  Get CBC, CMP, 2 view Chest X ray checked  by Primary MD next visit.    Activity: As tolerated with Full fall precautions use walker/cane & assistance as needed   Disposition  will discharge home with home care per family's request   Diet: Heart Healthy  , with feeding assistance and aspiration precautions.  For Heart failure patients - Check your Weight same time everyday, if you gain over 2 pounds, or you develop in leg swelling, experience more shortness of breath or chest pain, call your Primary MD immediately. Follow Cardiac Low Salt Diet and 1.5 lit/day fluid restriction.   On your next visit with your primary care physician please Get Medicines reviewed and adjusted.   Please request your Prim.MD to go over all Hospital Tests and Procedure/Radiological results at the follow up, please get all Hospital records sent to your Prim MD by signing hospital release before you go home.   If you experience worsening of your admission symptoms, develop shortness of breath, life threatening emergency, suicidal or homicidal thoughts you must seek medical attention immediately by calling 911 or calling your MD immediately  if symptoms less severe.  You Must read complete instructions/literature along with all the possible adverse reactions/side effects for all the Medicines you take and that have been prescribed to you. Take any new Medicines after you have completely understood and accpet all the possible adverse reactions/side effects.   Do not drive, operating heavy machinery, perform activities at heights, swimming or participation in water activities or provide baby sitting services if your were admitted for syncope or siezures until you have seen by Primary MD or a Neurologist  and advised to do so again.  Do not drive when taking Pain medications.    Do not take more than prescribed Pain, Sleep and Anxiety Medications  Special Instructions: If you have smoked or chewed Tobacco  in the last 2 yrs please stop smoking, stop any regular Alcohol  and or any Recreational drug use.  Wear Seat belts while driving.   Please note  You were cared for by a hospitalist during your hospital stay. If you have any questions about your discharge medications or the care you received while you were in the hospital after you are discharged, you can call the unit and asked to speak with the hospitalist on call if the hospitalist that took care of you is not available. Once you are discharged, your primary care physician will handle any further medical issues. Please note that NO REFILLS for any discharge medications will be authorized once you are discharged, as it is imperative that you return to your primary care physician (or establish a relationship with a primary care physician if you do not have one) for your aftercare needs so that they can reassess your need for medications and monitor your lab values.

## 2015-05-15 NOTE — Progress Notes (Signed)
Discharge instruction/paper and prescription for tramadol and xanax given to patient's wife. Wife verbalized understanding. Wife understands to call MD for follow up. [place], Drinda Butts, Charity fundraiser

## 2015-05-15 NOTE — Care Management Important Message (Signed)
Important Message  Patient Details  Name: George Anderson MRN: [id number] Date of Birth: 1926/08/06   Medicare Important Message Given:  Yes-second notification given    Orson Aloe 05/15/2015, 1:42 PM

## 2015-07-30 DEATH — deceased

## 2016-04-26 IMAGING — CT CT HEAD W/O CM
2 of 5 series · 11 of 37 positions shown, 13 images · non-contrast
Comparison: None.

CLINICAL DATA: Patient status post fall with large laceration on
the back of the head. No reported loss of consciousness.

EXAM:
CT HEAD WITHOUT CONTRAST
CT CERVICAL SPINE WITHOUT CONTRAST
TECHNIQUE: Multidetector CT imaging of the head and cervical spine was
performed following the standard protocol without intravenous
contrast. Multiplanar CT image reconstructions of the cervical spine
were also generated.

[Series 304: corrected axials · axial · 0.42mm/px · z∈[+76,+199]mm · 8 of 190 slices shown, 10 images]
[im 14/190  brain]
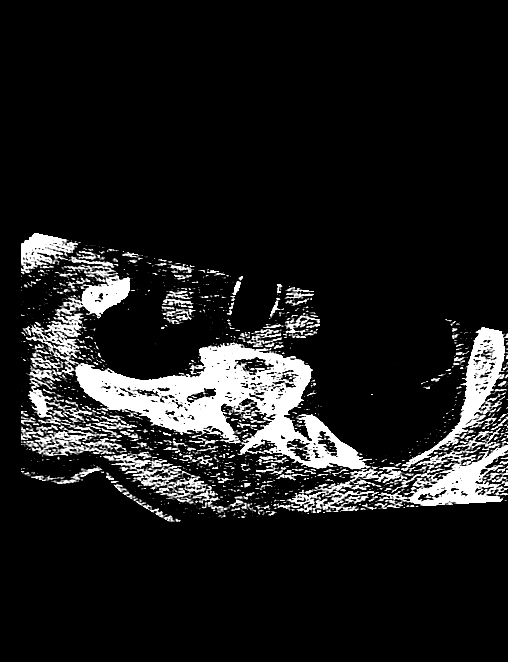
[im 14/190  bone]
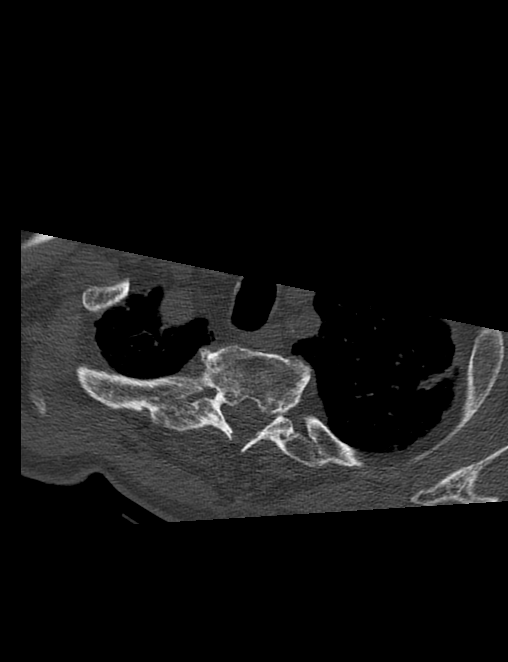
[im 41/190  brain]
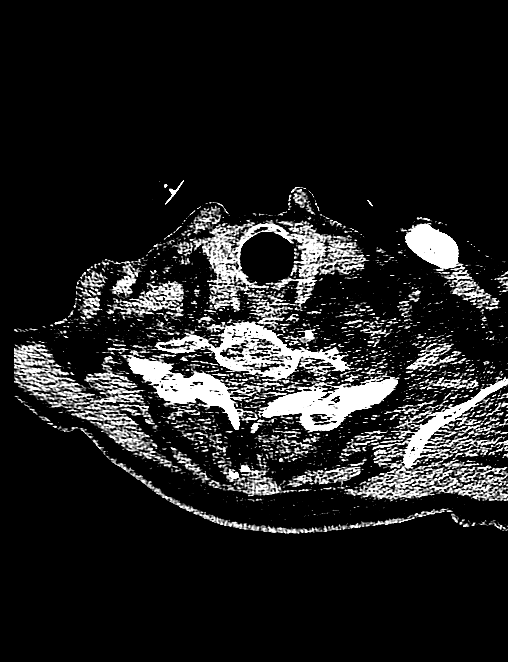
[im 68/190  brain]
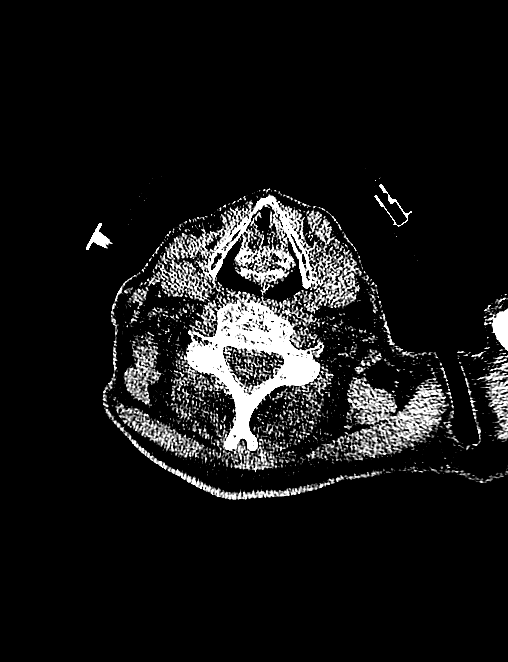
[im 82/190  brain]
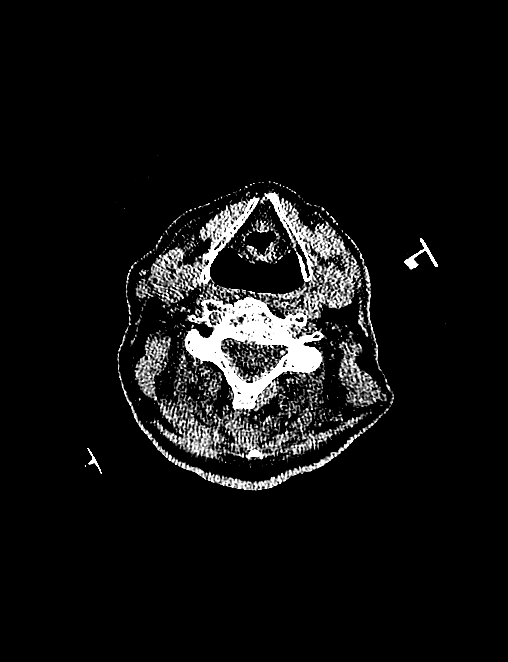
[im 109/190  brain]
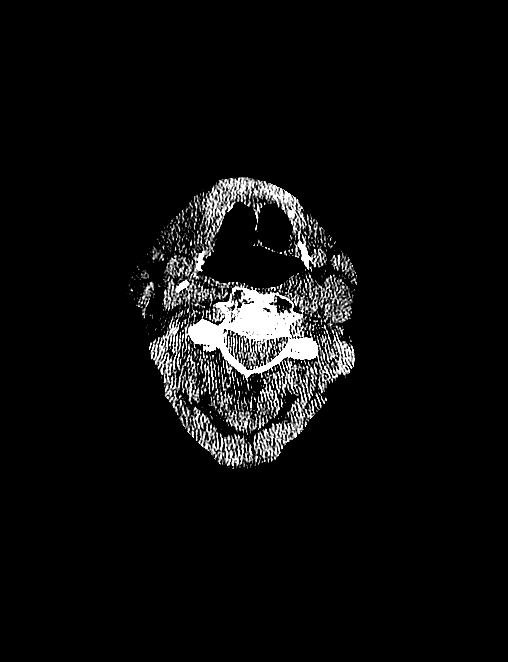
[im 109/190  bone]
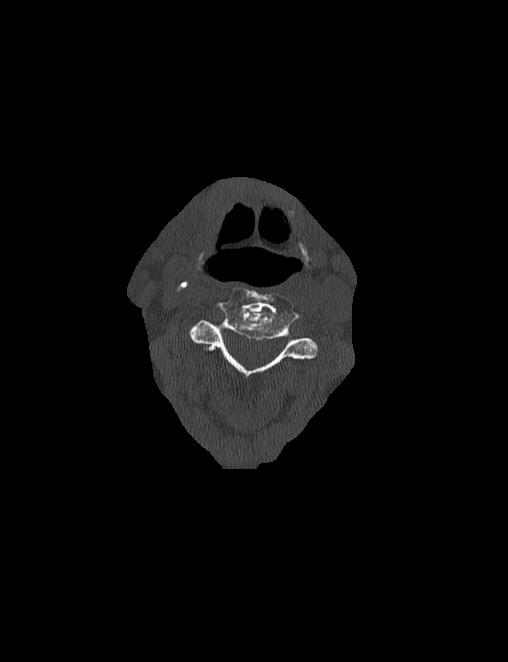
[im 122/190  brain]
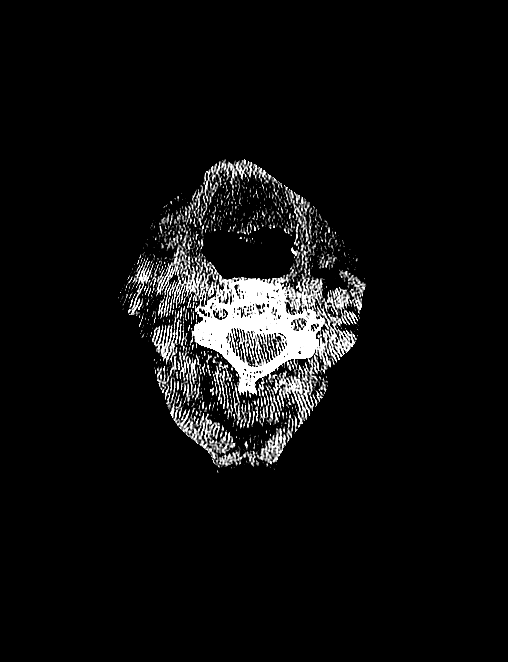
[im 149/190  brain]
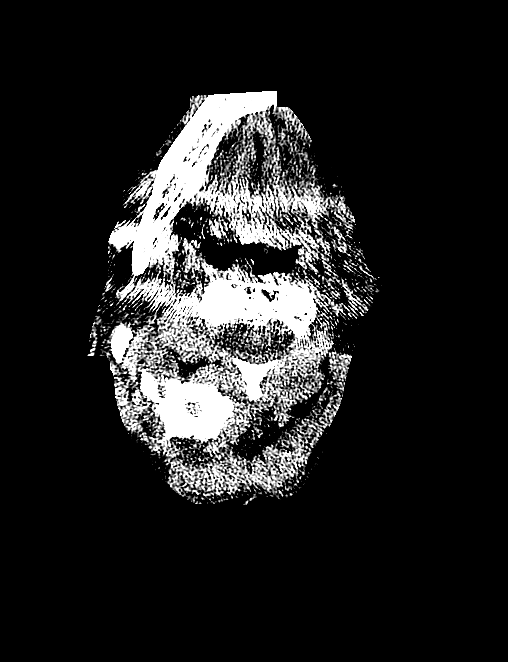
[im 176/190  brain]
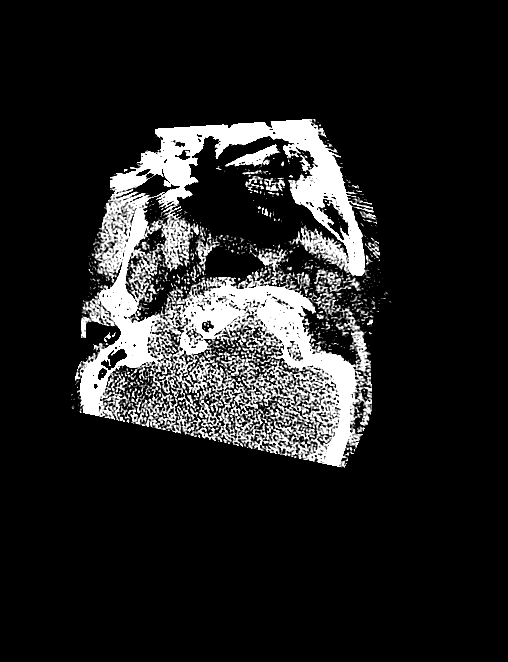

[Series 306: sag · sagittal · 0.42mm/px · 3 of 50 slices shown]
[im 17/50  brain]
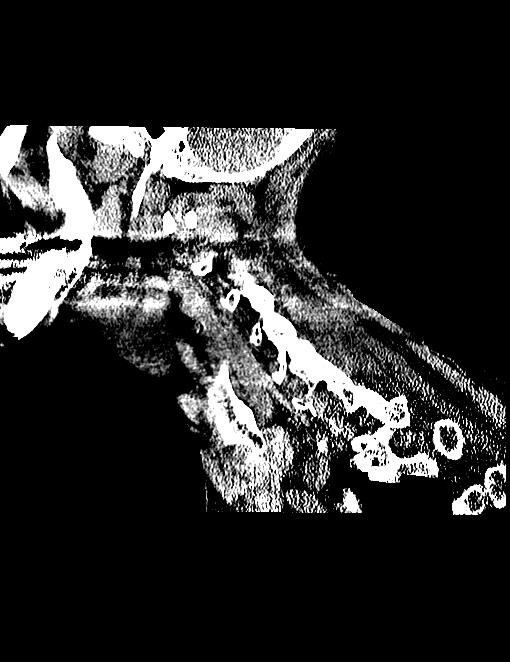
[im 25/50  brain]
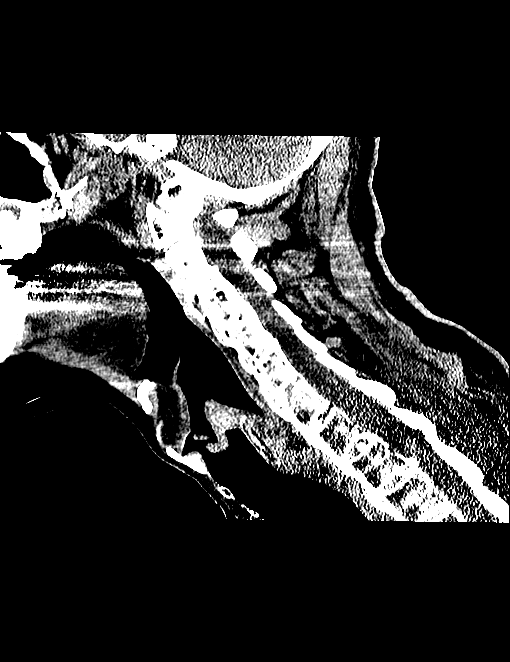
[im 33/50  brain]
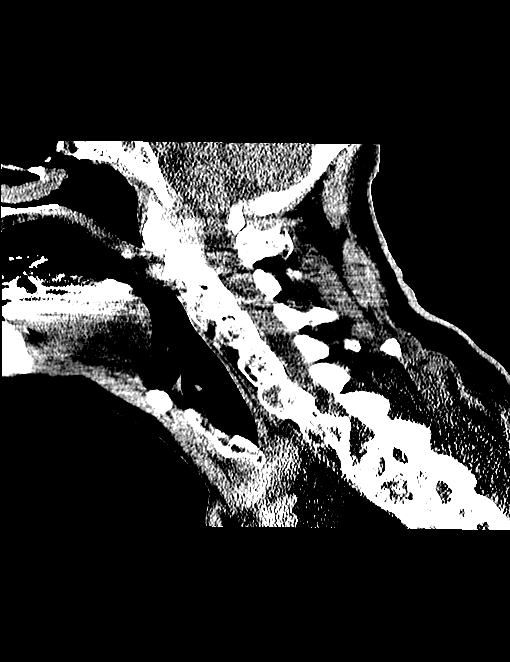

[11 of 37 positions shown; findings below may reference images not displayed]

FINDINGS: CT HEAD FINDINGS

Ventricles and sulci are prominent compatible with atrophy.
Periventricular and subcortical white matter hypodensity compatible
with chronic small vessel ischemic changes. Orbits are unremarkable.
Paranasal sinuses are unremarkable. Mastoid air cells are well
aerated. No displaced skull fracture. Soft tissue swelling and
laceration overlying left posterior calvarium.

CT CERVICAL SPINE FINDINGS

Straightening of the normal cervical lordosis. Flowing anterior
osteophytes compatible with ankylosis spondylitis. There is a 3
column fracture extending through C6 vertebral body extending to the
posterior elements. No evidence for hematoma within the canal on CT
imaging. Marked degenerative changes in the craniocervical junction.
Lateral masses articulate appropriately with the dens. Biapical
pulmonary nodularity measuring up to 7 mm within the left upper
lobe.
IMPRESSION: There is a 3 column fracture through C6 vertebral body extending to
the posterior elements. There is fusion of the cervical spine
secondary to ankylosis spondylitis.

No acute intracranial process. Soft tissue injury overlying the left
posterior calvarium.

Marked parenchymal atrophy more focal to the temporal lobes.

Biapical pulmonary nodularity measuring up to 7 mm. If the patient
is at high risk for bronchogenic carcinoma, follow-up chest CT at
3-3months is recommended. If the patient is at low risk for
bronchogenic carcinoma, follow-up chest CT at 6-12 months is
recommended. This recommendation follows the consensus statement:
Guidelines for Management of Small Pulmonary Nodules Detected on CT
Scans: A Statement from the [HOSPITAL] as published in

Critical Value/emergent results were called by telephone at the time
of interpretation on 05/12/2015 at [DATE] to Dr. SIS GALLI , who
verbally acknowledged these results.
